# Patient Record
Sex: Male | Born: 1945 | Race: Black or African American | Hispanic: No | Marital: Single | Smoking: Never smoker
Health system: Southern US, Community
[De-identification: ages and names within clinical notes are randomized; demographics above are authoritative.]

## PROBLEM LIST (undated history)

## (undated) DIAGNOSIS — N289 Disorder of kidney and ureter, unspecified: Secondary | ICD-10-CM

## (undated) DIAGNOSIS — I10 Essential (primary) hypertension: Secondary | ICD-10-CM

## (undated) HISTORY — PX: SHOULDER SURGERY: SHX246

## (undated) HISTORY — PX: TRANSURETHRAL RESECTION OF PROSTATE: SHX73

---

## 1998-10-06 ENCOUNTER — Encounter: Admission: RE | Admit: 1998-10-06 | Discharge: 1998-11-24 | Payer: Self-pay | Admitting: Orthopaedic Surgery

## 2010-07-05 ENCOUNTER — Emergency Department (HOSPITAL_BASED_OUTPATIENT_CLINIC_OR_DEPARTMENT_OTHER): Admission: EM | Admit: 2010-07-05 | Discharge: 2010-07-06 | Payer: Self-pay | Admitting: Emergency Medicine

## 2010-07-05 ENCOUNTER — Ambulatory Visit: Payer: Self-pay | Admitting: Diagnostic Radiology

## 2010-11-03 LAB — URINALYSIS, ROUTINE W REFLEX MICROSCOPIC
Nitrite: NEGATIVE
Protein, ur: NEGATIVE mg/dL
Specific Gravity, Urine: 1.007 (ref 1.005–1.030)
Urobilinogen, UA: 0.2 mg/dL (ref 0.0–1.0)

## 2010-11-03 LAB — COMPREHENSIVE METABOLIC PANEL
ALT: 21 U/L (ref 0–53)
AST: 25 U/L (ref 0–37)
Albumin: 4.4 g/dL (ref 3.5–5.2)
CO2: 26 mEq/L (ref 19–32)
Calcium: 9.6 mg/dL (ref 8.4–10.5)
GFR calc Af Amer: >60 mL/min (ref >60)
GFR calc non Af Amer: 56 mL/min — ABNORMAL LOW (ref >60)
Sodium: 140 mEq/L (ref 135–145)
Total Protein: 7.7 g/dL (ref 6.0–8.3)

## 2010-11-03 LAB — CBC
Hemoglobin: 14.7 g/dL (ref 13.0–17.0)
MCHC: 34.6 g/dL (ref 30.0–36.0)
Platelets: 296 10*3/uL (ref 150–400)
RBC: 4.73 MIL/uL (ref 4.22–5.81)

## 2010-11-03 LAB — DIFFERENTIAL
Eosinophils Absolute: 0 10*3/uL (ref 0.0–0.7)
Eosinophils Relative: 1 % (ref 0–5)
Lymphs Abs: 1.5 10*3/uL (ref 0.7–4.0)
Monocytes Absolute: 0.4 10*3/uL (ref 0.1–1.0)
Monocytes Relative: 9 % (ref 3–12)

## 2010-11-03 LAB — POCT CARDIAC MARKERS
CKMB, poc: 1.5 ng/mL (ref 1.0–8.0)
Myoglobin, poc: 71.5 ng/mL (ref 12–200)
Troponin i, poc: <0.05 ng/mL (ref 0.00–0.09)

## 2015-10-31 ENCOUNTER — Emergency Department (HOSPITAL_BASED_OUTPATIENT_CLINIC_OR_DEPARTMENT_OTHER): Payer: No Typology Code available for payment source

## 2015-10-31 ENCOUNTER — Encounter (HOSPITAL_BASED_OUTPATIENT_CLINIC_OR_DEPARTMENT_OTHER): Payer: Self-pay | Admitting: Emergency Medicine

## 2015-10-31 ENCOUNTER — Emergency Department (HOSPITAL_BASED_OUTPATIENT_CLINIC_OR_DEPARTMENT_OTHER)
Admission: EM | Admit: 2015-10-31 | Discharge: 2015-10-31 | Disposition: A | Discharge status: Home / Self Care | Payer: No Typology Code available for payment source | Attending: Emergency Medicine | Admitting: Emergency Medicine

## 2015-10-31 DIAGNOSIS — T148XXA Other injury of unspecified body region, initial encounter: Secondary | ICD-10-CM

## 2015-10-31 DIAGNOSIS — S0990XA Unspecified injury of head, initial encounter: Secondary | ICD-10-CM | POA: Insufficient documentation

## 2015-10-31 DIAGNOSIS — I10 Essential (primary) hypertension: Secondary | ICD-10-CM | POA: Insufficient documentation

## 2015-10-31 DIAGNOSIS — S3992XA Unspecified injury of lower back, initial encounter: Secondary | ICD-10-CM | POA: Insufficient documentation

## 2015-10-31 DIAGNOSIS — Z87448 Personal history of other diseases of urinary system: Secondary | ICD-10-CM | POA: Insufficient documentation

## 2015-10-31 DIAGNOSIS — Y92410 Unspecified street and highway as the place of occurrence of the external cause: Secondary | ICD-10-CM | POA: Insufficient documentation

## 2015-10-31 DIAGNOSIS — T148 Other injury of unspecified body region: Secondary | ICD-10-CM | POA: Insufficient documentation

## 2015-10-31 DIAGNOSIS — Y9389 Activity, other specified: Secondary | ICD-10-CM | POA: Insufficient documentation

## 2015-10-31 DIAGNOSIS — Z880 Allergy status to penicillin: Secondary | ICD-10-CM | POA: Insufficient documentation

## 2015-10-31 DIAGNOSIS — S4991XA Unspecified injury of right shoulder and upper arm, initial encounter: Secondary | ICD-10-CM | POA: Insufficient documentation

## 2015-10-31 DIAGNOSIS — Y998 Other external cause status: Secondary | ICD-10-CM | POA: Insufficient documentation

## 2015-10-31 DIAGNOSIS — S199XXA Unspecified injury of neck, initial encounter: Secondary | ICD-10-CM | POA: Insufficient documentation

## 2015-10-31 HISTORY — DX: Disorder of kidney and ureter, unspecified: N28.9

## 2015-10-31 HISTORY — DX: Essential (primary) hypertension: I10

## 2015-10-31 MED ORDER — ACETAMINOPHEN 325 MG PO TABS
650.0000 mg | ORAL_TABLET | Freq: Once | ORAL | Status: AC
  Administered 2015-10-31: 650 mg via ORAL
  Filled 2015-10-31: qty 2

## 2015-10-31 MED ORDER — CYCLOBENZAPRINE HCL 5 MG PO TABS: 5.0000 mg | ORAL_TABLET | Freq: Three times a day (TID) | ORAL | Status: DC | PRN

## 2015-10-31 MED ORDER — TRAMADOL HCL 50 MG PO TABS: 50.0000 mg | ORAL_TABLET | Freq: Four times a day (QID) | ORAL | Status: DC | PRN
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
## 2015-10-31 NOTE — Discharge Instructions (Signed)
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
## 2015-11-07 ENCOUNTER — Emergency Department (HOSPITAL_BASED_OUTPATIENT_CLINIC_OR_DEPARTMENT_OTHER): Payer: No Typology Code available for payment source

## 2015-11-07 ENCOUNTER — Emergency Department (HOSPITAL_BASED_OUTPATIENT_CLINIC_OR_DEPARTMENT_OTHER)
Admission: EM | Admit: 2015-11-07 | Discharge: 2015-11-07 | Disposition: A | Discharge status: Home / Self Care | Payer: No Typology Code available for payment source | Attending: Emergency Medicine | Admitting: Emergency Medicine

## 2015-11-07 ENCOUNTER — Encounter (HOSPITAL_BASED_OUTPATIENT_CLINIC_OR_DEPARTMENT_OTHER): Payer: Self-pay

## 2015-11-07 DIAGNOSIS — Z87448 Personal history of other diseases of urinary system: Secondary | ICD-10-CM | POA: Insufficient documentation

## 2015-11-07 DIAGNOSIS — Z79899 Other long term (current) drug therapy: Secondary | ICD-10-CM | POA: Diagnosis not present

## 2015-11-07 DIAGNOSIS — S46911A Strain of unspecified muscle, fascia and tendon at shoulder and upper arm level, right arm, initial encounter: Secondary | ICD-10-CM | POA: Insufficient documentation

## 2015-11-07 DIAGNOSIS — S4991XA Unspecified injury of right shoulder and upper arm, initial encounter: Secondary | ICD-10-CM | POA: Diagnosis present

## 2015-11-07 DIAGNOSIS — Y9389 Activity, other specified: Secondary | ICD-10-CM | POA: Insufficient documentation

## 2015-11-07 DIAGNOSIS — S199XXA Unspecified injury of neck, initial encounter: Secondary | ICD-10-CM | POA: Diagnosis not present

## 2015-11-07 DIAGNOSIS — Y92410 Unspecified street and highway as the place of occurrence of the external cause: Secondary | ICD-10-CM | POA: Insufficient documentation

## 2015-11-07 DIAGNOSIS — Z880 Allergy status to penicillin: Secondary | ICD-10-CM | POA: Insufficient documentation

## 2015-11-07 DIAGNOSIS — Y998 Other external cause status: Secondary | ICD-10-CM | POA: Diagnosis not present

## 2015-11-07 DIAGNOSIS — I10 Essential (primary) hypertension: Secondary | ICD-10-CM | POA: Insufficient documentation
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
## 2015-11-07 NOTE — Discharge Instructions (Signed)
Rotator Cuff Injury °Rotator cuff injury is any type of injury to the set of muscles and tendons that make up the stabilizing unit of your shoulder. This unit holds the ball of your upper arm bone (humerus) in the socket of your shoulder blade (scapula).  °CAUSES °Injuries to your rotator cuff most commonly come from sports or activities that cause your arm to be moved repeatedly over your head. Examples of this include throwing, weight lifting, swimming, or racquet sports. Long lasting (chronic) irritation of your rotator cuff can cause soreness and swelling (inflammation), bursitis, and eventual damage to your tendons, such as a tear (rupture). °SIGNS AND SYMPTOMS °Acute rotator cuff tear: °· Sudden tearing sensation followed by severe pain shooting from your upper shoulder down your arm toward your elbow. °· Decreased range of motion of your shoulder because of pain and muscle spasm. °· Severe pain. °· Inability to raise your arm out to the side because of pain and loss of muscle power (large tears). °Chronic rotator cuff tear: °· Pain that usually is worse at night and may interfere with sleep. °· Gradual weakness and decreased shoulder motion as the pain worsens. °· Decreased range of motion. °Rotator cuff tendinitis:  °· Deep ache in your shoulder and the outside upper arm over your shoulder. °· Pain that comes on gradually and becomes worse when lifting your arm to the side or turning it inward. °DIAGNOSIS °Rotator cuff injury is diagnosed through a medical history, physical exam, and imaging exam. The medical history helps determine the type of rotator cuff injury. Your health care provider will look at your injured shoulder, feel the injured area, and ask you to move your shoulder in different positions. X-ray exams typically are done to rule out other causes of shoulder pain, such as fractures. MRI is the exam of choice for the most severe shoulder injuries because the images show muscles and tendons.    °TREATMENT  °Chronic tear: °· Medicine for pain, such as acetaminophen or ibuprofen. °· Physical therapy and range-of-motion exercises may be helpful in maintaining shoulder function and strength. °· Steroid injections into your shoulder joint. °· Surgical repair of the rotator cuff if the injury does not heal with noninvasive treatment. °Acute tear: °· Anti-inflammatory medicines such as ibuprofen and naproxen to help reduce pain and swelling. °· A sling to help support your arm and rest your rotator cuff muscles. Long-term use of a sling is not advised. It may cause significant stiffening of the shoulder joint. °· Surgery may be considered within a few weeks, especially in younger, active people, to return the shoulder to full function. °· Indications for surgical treatment include the following: °¨ Age younger than 60 years. °¨ Rotator cuff tears that are complete. °¨ Physical therapy, rest, and anti-inflammatory medicines have been used for 6-8 weeks, with no improvement. °¨ Employment or sporting activity that requires constant shoulder use. °Tendinitis: °· Anti-inflammatory medicines such as ibuprofen and naproxen to help reduce pain and swelling. °· A sling to help support your arm and rest your rotator cuff muscles. Long-term use of a sling is not advised. It may cause significant stiffening of the shoulder joint. °· Severe tendinitis may require: °¨ Steroid injections into your shoulder joint. °¨ Physical therapy. °¨ Surgery. °HOME CARE INSTRUCTIONS  °· Apply ice to your injury: °¨ Put ice in a plastic bag. °¨ Place a towel between your skin and the bag. °¨ Leave the ice on for 20 minutes, 2-3 times a day. °· If you   have a shoulder immobilizer (sling and straps), wear it until told otherwise by your health care provider. °· You may want to sleep on several pillows or in a recliner at night to lessen swelling and pain. °· Only take over-the-counter or prescription medicines for pain, discomfort, or fever as  directed by your health care provider. °· Do simple hand squeezing exercises with a soft rubber ball to decrease hand swelling. °SEEK MEDICAL CARE IF:  °· Your shoulder pain increases, or new pain or numbness develops in your arm, hand, or fingers. °· Your hand or fingers are colder than your other hand. °SEEK IMMEDIATE MEDICAL CARE IF:  °· Your arm, hand, or fingers are numb or tingling. °· Your arm, hand, or fingers are increasingly swollen and painful, or they turn white or blue. °MAKE SURE YOU: °· Understand these instructions. °· Will watch your condition. °· Will get help right away if you are not doing well or get worse. °  °This information is not intended to replace advice given to you by your health care provider. Make sure you discuss any questions you have with your health care provider. °  °Document Released: 08/06/2000 Document Revised: 08/14/2013 Document Reviewed: 03/21/2013 °Elsevier Interactive Patient Education ©2016 [employer] ° °

## 2015-11-07 NOTE — ED Notes (Signed)
Pt verbalizes understanding of d/c instructions and denies any further needs at this time. 

## 2016-09-19 ENCOUNTER — Encounter (HOSPITAL_BASED_OUTPATIENT_CLINIC_OR_DEPARTMENT_OTHER): Payer: Self-pay | Admitting: *Deleted

## 2016-09-19 ENCOUNTER — Emergency Department (HOSPITAL_BASED_OUTPATIENT_CLINIC_OR_DEPARTMENT_OTHER)
Admission: EM | Admit: 2016-09-19 | Discharge: 2016-09-19 | Disposition: A | Discharge status: Home / Self Care | Payer: Medicare Other | Attending: Emergency Medicine | Admitting: Emergency Medicine

## 2016-09-19 DIAGNOSIS — Z79899 Other long term (current) drug therapy: Secondary | ICD-10-CM | POA: Insufficient documentation

## 2016-09-19 DIAGNOSIS — M545 Low back pain: Secondary | ICD-10-CM | POA: Diagnosis present

## 2016-09-19 DIAGNOSIS — M5416 Radiculopathy, lumbar region: Secondary | ICD-10-CM | POA: Diagnosis not present

## 2016-09-19 DIAGNOSIS — M5410 Radiculopathy, site unspecified: Secondary | ICD-10-CM

## 2016-09-19 DIAGNOSIS — I10 Essential (primary) hypertension: Secondary | ICD-10-CM | POA: Insufficient documentation

## 2016-09-19 DIAGNOSIS — M5441 Lumbago with sciatica, right side: Secondary | ICD-10-CM | POA: Diagnosis not present

## 2016-09-19 MED ORDER — DEXAMETHASONE 6 MG PO TABS
10.0000 mg | ORAL_TABLET | Freq: Once | ORAL | Status: AC
  Administered 2016-09-19: 10 mg via ORAL
  Filled 2016-09-19: qty 1

## 2016-09-19 MED ORDER — TRAMADOL HCL 50 MG PO TABS: 50.0000 mg | ORAL_TABLET | Freq: Four times a day (QID) | ORAL | 0 refills | Status: DC | PRN
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
## 2016-09-19 NOTE — ED Provider Notes (Signed)
[facility] Provider Note   CSN: [id number] Arrival date & time: 09/19/16  1325  By signing my name below, I, Modena Jansky, attest that this documentation has been prepared under the direction and in the presence of Alvira Monday, MD. Electronically Signed: Modena Jansky, Scribe. 09/19/2016. 4:08 PM.  History   Chief Complaint Chief Complaint  Patient presents with  . Leg Pain   The history is provided by the patient. No language interpreter was used.   HPI Comments: Marc Vargas is a 71 y.o. male who presents to the Emergency Department complaining of intermittent moderate lower back pain that started about 3 weeks ago. He states he fell and twisted his RLE, and his pain has gradually worsened since. He tried soaking himself in epsom salts and flexeril without relief. His pain is relieved by sitting and exacerbated by standing and ambulation. He describes the pain as a dull, aching sensation radiating to his buttocks and right shin. He currently rates the pain as an 8/10. No associated symptoms. He admits to a prior hx of back problems. He denies any numbness, saddle paresthesias, fever, bowel/bladder incontinence, leg swelling, weakness, or SOB. No IVDU or cancer hx.    PCP: Ananias Pilgrim, MD  Past Medical History:  Diagnosis Date  . Hypertension   . Renal disorder     There are no active problems to display for this patient.   Past Surgical History:  Procedure Laterality Date  . SHOULDER SURGERY         Home Medications    Prior to Admission medications   Medication Sig Start Date End Date Taking? Authorizing Provider  amLODipine-atorvastatin (CADUET) 10-40 MG tablet Take 1 tablet by mouth daily.   Yes Historical Provider, MD  lisinopril (PRINIVIL,ZESTRIL) 10 MG tablet Take 10 mg by mouth daily.   Yes Historical Provider, MD  cyclobenzaprine (FLEXERIL) 5 MG tablet Take 1 tablet (5 mg total) by mouth 3 (three) times daily as needed for muscle spasms.  10/31/15   Linwood Dibbles, MD  traMADol (ULTRAM) 50 MG tablet Take 1 tablet (50 mg total) by mouth every 6 (six) hours as needed. 09/19/16   Alvira Monday, MD    Family History No family history on file.  Social History Social History  Substance Use Topics  . Smoking status: Never Smoker  . Smokeless tobacco: Never Used  . Alcohol use No     Allergies   Penicillins   Review of Systems Review of Systems  Constitutional: Negative for fever.  HENT: Negative for sore throat.   Eyes: Negative for visual disturbance.  Respiratory: Negative for shortness of breath.   Cardiovascular: Negative for chest pain and leg swelling.  Gastrointestinal: Negative for abdominal pain.  Genitourinary: Negative for difficulty urinating.  Musculoskeletal: Positive for back pain (Lower). Negative for neck stiffness.  Skin: Negative for rash.  Neurological: Negative for syncope, weakness, numbness and headaches.     Physical Exam Updated Vital Signs BP 132/71 (BP Location: Right Arm)   Pulse 74   Temp 98.2 F (36.8 C) (Oral)   Resp 18   Ht 5\' 7"  (1.702 m)   Wt 207 lb (93.9 kg)   SpO2 100%   BMI 32.42 kg/m   Physical Exam  Constitutional: He is oriented to person, place, and time. He appears well-developed and well-nourished. No distress.  HENT:  Head: Normocephalic and atraumatic.  Eyes: Conjunctivae are normal.  Neck: Neck supple.  Cardiovascular: Normal rate and regular rhythm.   Pulmonary/Chest: Effort normal.  No respiratory distress. He has no wheezes. He has no rales.  Abdominal: Soft.  Musculoskeletal: Normal range of motion. He exhibits no edema.  Right paraspinal TTP. No calf TTP. Strength in BLE is 5/5, with hip flexion/extension, knee flexion/extension, and plantar flexion and dorsiflexion. Sensation intact.   Neurological: He is alert and oriented to person, place, and time.  Skin: Skin is warm and dry.  Psychiatric: He has a normal mood and affect.  Nursing note and vitals  reviewed.    ED Treatments / Results  DIAGNOSTIC STUDIES: Oxygen Saturation is 100% on RA, normal by my interpretation.    COORDINATION OF CARE: 4:12 PM- Pt advised of plan for treatment and pt agrees.  Labs (all labs ordered are listed, but only abnormal results are displayed) Labs Reviewed - No data to display  EKG  EKG Interpretation None       Radiology No results found.  Procedures Procedures (including critical care time)  Medications Ordered in ED Medications  dexamethasone (DECADRON) tablet 10 mg (10 mg Oral Given 09/19/16 1627)     Initial Impression / Assessment and Plan / ED Course  I have reviewed the triage vital signs and the nursing notes.  Pertinent labs & imaging results that were available during my care of the patient were reviewed by me and considered in my medical decision making (see chart for details).     71yo male presents with concern for back pain. Patient has a normal neurologic exam and denies any urinary retention or overflow incontinence, stool incontinence, saddle anesthesia, fever, IV drug use, trauma, chronic steroid use or immunocompromise and have low suspicion suspicion for cauda equina, fracture, epidural abscess, or vertebral osteomyelitis.  Patient describing radicular pain, possible disc herniation or other.  Reviewed pt in the drug database. Wrote rx for tramadol and recommended PCP follow up for further pain. Patient discharged in stable condition with understanding of reasons to return.   Final Clinical Impressions(s) / ED Diagnoses   Final diagnoses:  Radicular pain  Acute right-sided low back pain with right-sided sciatica    New Prescriptions Discharge Medication List as of 09/19/2016  4:24 PM     I personally performed the services described in this documentation, which was scribed in my presence. The recorded information has been reviewed and is accurate.     Alvira MondayErin Derotha Fishbaugh, MD 09/20/16 [phone]

## 2016-09-23 DEATH — deceased

## 2017-09-18 ENCOUNTER — Encounter (HOSPITAL_BASED_OUTPATIENT_CLINIC_OR_DEPARTMENT_OTHER): Payer: Self-pay | Admitting: Emergency Medicine

## 2017-09-18 ENCOUNTER — Other Ambulatory Visit: Payer: Self-pay

## 2017-09-18 ENCOUNTER — Emergency Department (HOSPITAL_BASED_OUTPATIENT_CLINIC_OR_DEPARTMENT_OTHER)
Admission: EM | Admit: 2017-09-18 | Discharge: 2017-09-18 | Disposition: A | Discharge status: Home / Self Care | Payer: Medicare Other | Attending: Emergency Medicine | Admitting: Emergency Medicine

## 2017-09-18 ENCOUNTER — Emergency Department (HOSPITAL_BASED_OUTPATIENT_CLINIC_OR_DEPARTMENT_OTHER): Payer: Medicare Other

## 2017-09-18 DIAGNOSIS — N289 Disorder of kidney and ureter, unspecified: Secondary | ICD-10-CM

## 2017-09-18 DIAGNOSIS — Z79899 Other long term (current) drug therapy: Secondary | ICD-10-CM | POA: Insufficient documentation

## 2017-09-18 DIAGNOSIS — R079 Chest pain, unspecified: Secondary | ICD-10-CM | POA: Diagnosis present

## 2017-09-18 DIAGNOSIS — I10 Essential (primary) hypertension: Secondary | ICD-10-CM | POA: Insufficient documentation

## 2017-09-18 LAB — HEPATIC FUNCTION PANEL
ALT: 17 U/L (ref 17–63)
AST: 24 U/L (ref 15–41)
Albumin: 4.3 g/dL (ref 3.5–5.0)
Alkaline Phosphatase: 67 U/L (ref 38–126)
BILIRUBIN DIRECT: 0.2 mg/dL (ref 0.1–0.5)
BILIRUBIN TOTAL: 1.6 mg/dL — AB (ref 0.3–1.2)
Indirect Bilirubin: 1.4 mg/dL — ABNORMAL HIGH (ref 0.3–0.9)
Total Protein: 7.3 g/dL (ref 6.5–8.1)

## 2017-09-18 LAB — BASIC METABOLIC PANEL
ANION GAP: 8 (ref 5–15)
BUN: 21 mg/dL — AB (ref 6–20)
CALCIUM: 9.3 mg/dL (ref 8.9–10.3)
CO2: 25 mmol/L (ref 22–32)
CREATININE: 1.69 mg/dL — AB (ref 0.61–1.24)
Chloride: 108 mmol/L (ref 101–111)
GFR calc Af Amer: 45 mL/min — ABNORMAL LOW (ref >60)
GFR, EST NON AFRICAN AMERICAN: 39 mL/min — AB (ref >60)
GLUCOSE: 135 mg/dL — AB (ref 65–99)
Potassium: 4 mmol/L (ref 3.5–5.1)
Sodium: 141 mmol/L (ref 135–145)

## 2017-09-18 LAB — CBC
HCT: 41.4 % (ref 39.0–52.0)
Hemoglobin: 14.5 g/dL (ref 13.0–17.0)
MCH: 31.6 pg (ref 26.0–34.0)
MCHC: 35 g/dL (ref 30.0–36.0)
MCV: 90.2 fL (ref 78.0–100.0)
PLATELETS: 242 10*3/uL (ref 150–400)
RBC: 4.59 MIL/uL (ref 4.22–5.81)
RDW: 12.8 % (ref 11.5–15.5)
WBC: 5.7 10*3/uL (ref 4.0–10.5)

## 2017-09-18 LAB — TROPONIN I

## 2017-09-18 LAB — LIPASE, BLOOD: LIPASE: 42 U/L (ref 11–51)

## 2017-09-18 NOTE — ED Provider Notes (Signed)
[facility] EMERGENCY DEPARTMENT Provider Note   CSN: [id number] Arrival date & time: 09/18/17  0735     History   Chief Complaint Chief Complaint  Patient presents with  . Chest Pain    Described as discomfort and feeling like something is stuck right in the middle    HPI Marc Vargas is a 72 y.o. male.  HPI Patient presents with mid chest fullness.  Has had constantly for the last week.  Feels as if something is stuck in the middle.  Not change his exercise.  Not change with eating.  No nausea or vomiting.  States he has had some worsening reflux.  States that has had previous cardiac workup with no pathology.  Has had previous endoscopy around 6 years ago.  States at that time the asked if he had been drinking because of some of the findings but states he does not drink alcohol.  He does not smoke.  No nausea or vomiting.  No weight loss. Past Medical History:  Diagnosis Date  . Hypertension   . Renal disorder     There are no active problems to display for this patient.   Past Surgical History:  Procedure Laterality Date  . SHOULDER SURGERY         Home Medications    Prior to Admission medications   Medication Sig Start Date End Date Taking? Authorizing Provider  amLODipine-atorvastatin (CADUET) 10-40 MG tablet Take 1 tablet by mouth daily.   Yes [provider]  cyclobenzaprine (FLEXERIL) 5 MG tablet Take 1 tablet (5 mg total) by mouth 3 (three) times daily as needed for muscle spasms. 10/31/15  Yes Linwood DibblesKnapp, Jon, MD  lisinopril (PRINIVIL,ZESTRIL) 10 MG tablet Take 10 mg by mouth daily.   Yes [provider]  traMADol (ULTRAM) 50 MG tablet Take 1 tablet (50 mg total) by mouth every 6 (six) hours as needed. 09/19/16  Yes Alvira MondaySchlossman, Erin, MD    Family History No family history on file.  Social History Social History   Tobacco Use  . Smoking status: Never Smoker  . Smokeless tobacco: Never Used  Substance Use Topics  . Alcohol  use: No  . Drug use: No     Allergies   Penicillins and Propofol   Review of Systems Review of Systems  Constitutional: Negative for fever.  HENT: Negative for congestion.   Respiratory: Negative for shortness of breath.   Cardiovascular: Positive for chest pain.  Gastrointestinal: Negative for abdominal pain and constipation.  Genitourinary: Negative for flank pain.  Musculoskeletal: Negative for back pain.  Neurological: Negative for weakness and numbness.  Hematological: Negative for adenopathy.  Psychiatric/Behavioral: Negative for confusion.     Physical Exam Updated Vital Signs BP (!) 141/74   Pulse 60   Temp 98.1 F (36.7 C) (Oral)   Resp 14   Ht 5\' 7"  (1.702 m)   Wt 88 kg (194 lb)   SpO2 99%   BMI 30.38 kg/m   Physical Exam  Constitutional: He appears well-developed.  HENT:  Head: Normocephalic.  Neck: Neck supple.  Cardiovascular: Normal rate, regular rhythm and normal pulses.  Pulmonary/Chest: Effort normal.  Abdominal: He exhibits no mass. There is no tenderness.  Musculoskeletal:       Right lower leg: He exhibits no edema.       Left lower leg: He exhibits no edema.  Neurological: He is alert.  Skin: Skin is warm. Capillary refill takes less than 2 seconds.  ED Treatments / Results  Labs (all labs ordered are listed, but only abnormal results are displayed) Labs Reviewed  BASIC METABOLIC PANEL - Abnormal; Notable for the following components:      Result Value   Glucose, Bld 135 (*)    BUN 21 (*)    Creatinine, Ser 1.69 (*)    GFR calc non Af Amer 39 (*)    GFR calc Af Amer 45 (*)    All other components within normal limits  HEPATIC FUNCTION PANEL - Abnormal; Notable for the following components:   Total Bilirubin 1.6 (*)    Indirect Bilirubin 1.4 (*)    All other components within normal limits  CBC  TROPONIN I  LIPASE, BLOOD    EKG  EKG Interpretation  Date/Time:  Sunday September 18 2017 07:47:55 EST Ventricular Rate:   69 PR Interval:    QRS Duration: 86 QT Interval:  395 QTC Calculation: 424 R Axis:   -10 Text Interpretation:  Sinus rhythm Borderline T wave abnormalities No significant change since last tracing Confirmed by Benjiman Core [phone]) on 09/18/2017 8:00:59 AM       Radiology Dg Chest 2 View  Result Date: 09/18/2017 CLINICAL DATA:  72 year old male with a history of chest pain EXAM: CHEST  2 VIEW COMPARISON:  02/23/2014 FINDINGS: Cardiomediastinal silhouette unchanged in size and contour. Low lung volumes, accentuating the interstitium. No pneumothorax or pleural effusion. Likely chronic changes at the lung bases. No displaced fracture Surgical changes of the right humeral head IMPRESSION: Chronic lung changes without evidence of superimposed acute cardiopulmonary disease. Electronically Signed   By: Gilmer Mor D.O.   On: 09/18/2017 08:39    Procedures Procedures (including critical care time)  Medications Ordered in ED Medications - No data to display   Initial Impression / Assessment and Plan / ED Course  I have reviewed the triage vital signs and the nursing notes.  Pertinent labs & imaging results that were available during my care of the patient were reviewed by me and considered in my medical decision making (see chart for details).     Patient presents with mid chest tightness.  Feels as if something is in there.  Has chronic renal insufficiency which appears to have worsened somewhat.  It does not initially worsen with eating.  EKG reassuring.  Troponin negative.  Chest x-ray reassuring.  Has had some previous esophageal or stomach abnormality but unsure what it was.  Has recently been on some steroids which could cause gastritis or ulcer.  Already on Nexium.  Will have follow-up with PCP nephrology probably his gastroenterologist.  Doubt cardiac cause.  Will discharge home.  Final Clinical Impressions(s) / ED Diagnoses   Final diagnoses:  Nonspecific chest pain  Renal  insufficiency    ED Discharge Orders    None       Benjiman Core, MD 09/18/17 [phone]

## 2017-09-18 NOTE — ED Triage Notes (Signed)
Pt c/o center chest discomfort occurred after eating, described as feeling like something is stuck x 1 week. Pt reports symptoms of GERD.

## 2017-09-18 NOTE — Discharge Instructions (Signed)
Follow-up with your primary care doctor.  You may need to see gastroenterology for the pain also.  Kidney function also appears to be worse than baseline.

## 2021-11-15 ENCOUNTER — Encounter (HOSPITAL_BASED_OUTPATIENT_CLINIC_OR_DEPARTMENT_OTHER): Payer: Self-pay

## 2021-11-15 ENCOUNTER — Emergency Department (HOSPITAL_BASED_OUTPATIENT_CLINIC_OR_DEPARTMENT_OTHER): Payer: Medicare Other

## 2021-11-15 ENCOUNTER — Other Ambulatory Visit: Payer: Self-pay

## 2021-11-15 ENCOUNTER — Emergency Department (HOSPITAL_BASED_OUTPATIENT_CLINIC_OR_DEPARTMENT_OTHER)
Admission: EM | Admit: 2021-11-15 | Discharge: 2021-11-15 | Disposition: A | Discharge status: Home / Self Care | Payer: Medicare Other | Attending: Emergency Medicine | Admitting: Emergency Medicine

## 2021-11-15 DIAGNOSIS — M25512 Pain in left shoulder: Secondary | ICD-10-CM | POA: Insufficient documentation

## 2021-11-15 DIAGNOSIS — Y990 Civilian activity done for income or pay: Secondary | ICD-10-CM | POA: Diagnosis not present

## 2021-11-15 DIAGNOSIS — X500XXA Overexertion from strenuous movement or load, initial encounter: Secondary | ICD-10-CM | POA: Diagnosis not present

## 2021-11-15 MED ORDER — NAPROXEN 250 MG PO TABS
500.0000 mg | ORAL_TABLET | Freq: Once | ORAL | Status: DC
  Filled 2021-11-15: qty 2

## 2021-11-15 MED ORDER — HYDROCODONE-ACETAMINOPHEN 5-325 MG PO TABS
1.0000 | ORAL_TABLET | Freq: Once | ORAL | Status: DC
  Filled 2021-11-15: qty 1

## 2021-11-15 MED ORDER — ACETAMINOPHEN 325 MG PO TABS
650.0000 mg | ORAL_TABLET | Freq: Once | ORAL | Status: AC
  Administered 2021-11-15: 650 mg via ORAL
  Filled 2021-11-15: qty 2

## 2021-11-15 MED ORDER — LIDOCAINE 5 % EX PTCH: 1.0000 | MEDICATED_PATCH | CUTANEOUS | 0 refills | Status: DC

## 2021-11-15 NOTE — Discharge Instructions (Signed)
Please take Tylenol every 6 hours as needed for pain.  I will refill your lidocaine patches.  Please follow-up with orthopedics for further evaluation. ?

## 2021-11-15 NOTE — ED Triage Notes (Signed)
C/o left shoulder pain x 1 week, pain worse with lifting arm. 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 Some improvement with lidocaine patch ?

## 2021-11-15 NOTE — ED Provider Notes (Signed)
?[facility] EMERGENCY DEPARTMENT ?Provider Note ? ? ?CSN: [id number] ?Arrival date & time: 11/15/21  2878 ? ?  ? ?History ?Chief Complaint  ?Patient presents with  ? Shoulder Pain  ? ? ?Marc Vargas is a 76 y.o. male who presents the emergency department with left shoulder pain that has been constant over the last 2 weeks.  Patient states he was pushing himself off while getting up from a chair and felt pain.  Pain is worse when he raises his arm above his head.  He denies any focal weakness or numbness.  No additional injury or trauma. ? ? ?Shoulder Pain ? ?  ? ?Home Medications ?Prior to Admission medications   ?Medication Sig Start Date End Date Taking? Authorizing Provider  ?lidocaine (LIDODERM) 5 % Place 1 patch onto the skin daily. Remove & Discard patch within 12 hours or as directed by MD 11/15/21  Yes Meredeth Ide, Jannel Lynne M, PA-C  ?amLODipine-atorvastatin (CADUET) 10-40 MG tablet Take 1 tablet by mouth daily.    [provider]  ?cyclobenzaprine (FLEXERIL) 5 MG tablet Take 1 tablet (5 mg total) by mouth 3 (three) times daily as needed for muscle spasms. 10/31/15   Linwood Dibbles, MD  ?lisinopril (PRINIVIL,ZESTRIL) 10 MG tablet Take 10 mg by mouth daily.    [provider]  ?traMADol (ULTRAM) 50 MG tablet Take 1 tablet (50 mg total) by mouth every 6 (six) hours as needed. 09/19/16   Alvira Monday, MD  ?   ? ?Allergies    ?Penicillins and Propofol   ? ?Review of Systems   ?Review of Systems  ?All other systems reviewed and are negative. ? ?Physical Exam ?Updated Vital Signs ?BP (!) 144/70 (BP Location: Right Arm)   Pulse 66   Temp 98 ?F (36.7 ?C) (Oral)   Resp 17   Ht 5\' 6"  (1.676 m)   Wt 82.6 kg   SpO2 100%   BMI 29.38 kg/m?  ?Physical Exam ?Vitals and nursing note reviewed.  ?Constitutional:   ?   Appearance: Normal appearance.  ?HENT:  ?   Head: Normocephalic and atraumatic.  ?Eyes:  ?   General:     ?   Right eye: No discharge.     ?   Left eye: No discharge.  ?    Conjunctiva/sclera: Conjunctivae normal.  ?Pulmonary:  ?   Effort: Pulmonary effort is normal.  ?Musculoskeletal:  ?   Comments: Positive speeds test.  There is tenderness over the left bicep tendon just over the bicipital groove.  Strong 2+ radial pulse felt on the left wrist.  Good cap refill in the fingers.  Normal sensation.  Good strength.  Patient does have good range of motion however when raising the arm above the head it is painful.  ?Skin: ?   General: Skin is warm and dry.  ?   Findings: No rash.  ?Neurological:  ?   General: No focal deficit present.  ?   Mental Status: He is alert.  ?Psychiatric:     ?   Mood and Affect: Mood normal.     ?   Behavior: Behavior normal.  ? ? ?ED Results / Procedures / Treatments   ?Labs ?(all labs ordered are listed, but only abnormal results are displayed) ?Labs Reviewed - No data to display ? ?EKG ?None ? ?Radiology ?DG Shoulder Left ? ?Result Date: 11/15/2021 ?CLINICAL DATA:  Shoulder pain EXAM: LEFT SHOULDER - 2+ VIEW COMPARISON:  None. FINDINGS: No acute fracture or dislocation identified.  Moderate narrowing of the glenohumeral joint and mild narrowing of the acromioclavicular joint with prominent inferior marginal osteophytes at the [facility] joint. 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 Electronically Signed   By: Jannifer Hick M.D.   On: 11/15/2021 10:35   ? ?Procedures ?Procedures  ? ? ?Medications Ordered in ED ?Medications  ?acetaminophen (TYLENOL) tablet 650 mg (650 mg Oral Given 11/15/21 1100)  ? ? ?ED Course/ Medical Decision Making/ A&P ?  ?                        ?Medical Decision Making ?Amount and/or Complexity of Data Reviewed ?Radiology: ordered. ? ?Risk ?Prescription drug management. ? ? ?RENALD Vargas is a 76 y.o. male who presents to the emergency department with left shoulder pain and has been ongoing for the last 2 weeks.  Differential diagnosis includes rotator cuff injury, impingement syndrome, biceps  tendinopathy, muscular spasm, bony fracture or dislocation.  Patient does have full range of motion however it is painful when arm is raised above shoulder.  There was evidence of positive speeds test with point tenderness to the bicep tendon over the bicipital groove.  Patient only has 1 patch of lidocaine left.  I will refill this.  Will have him take Tylenol and follow-up with orthopedics.  No weakness to the arm or numbness.  Strict return precautions were discussed with the patient.  He is safe for discharge. ? ?Final Clinical Impression(s) / ED Diagnoses ?Final diagnoses:  ?Acute pain of left shoulder  ? ? ?Rx / DC Orders ?ED Discharge Orders   ? ?      Ordered  ?  lidocaine (LIDODERM) 5 %  Every 24 hours       ? 11/15/21 1055  ? ?  ?  ? ?  ? ? ?  ?Teressa Lower, PA-C ?11/15/21 1105 ? ?  ?Linwood Dibbles, MD ?11/16/21 [phone] ? ?

## 2021-12-09 ENCOUNTER — Encounter (HOSPITAL_BASED_OUTPATIENT_CLINIC_OR_DEPARTMENT_OTHER): Payer: Self-pay | Admitting: Emergency Medicine

## 2021-12-09 ENCOUNTER — Other Ambulatory Visit: Payer: Self-pay

## 2021-12-09 ENCOUNTER — Emergency Department (HOSPITAL_BASED_OUTPATIENT_CLINIC_OR_DEPARTMENT_OTHER)
Admission: EM | Admit: 2021-12-09 | Discharge: 2021-12-09 | Disposition: A | Discharge status: Home / Self Care | Payer: Medicare Other | Attending: Emergency Medicine | Admitting: Emergency Medicine

## 2021-12-09 DIAGNOSIS — D649 Anemia, unspecified: Secondary | ICD-10-CM | POA: Insufficient documentation

## 2021-12-09 DIAGNOSIS — R1030 Lower abdominal pain, unspecified: Secondary | ICD-10-CM

## 2021-12-09 DIAGNOSIS — R1031 Right lower quadrant pain: Secondary | ICD-10-CM | POA: Insufficient documentation

## 2021-12-09 LAB — CBC WITH DIFFERENTIAL/PLATELET
Abs Immature Granulocytes: 0.02 10*3/uL (ref 0.00–0.07)
Basophils Absolute: 0 10*3/uL (ref 0.0–0.1)
Basophils Relative: 1 %
Eosinophils Absolute: 0.1 10*3/uL (ref 0.0–0.5)
Eosinophils Relative: 1 %
HCT: 35.4 % — ABNORMAL LOW (ref 39.0–52.0)
Hemoglobin: 12.3 g/dL — ABNORMAL LOW (ref 13.0–17.0)
Immature Granulocytes: 0 %
Lymphocytes Relative: 29 %
Lymphs Abs: 1.8 10*3/uL (ref 0.7–4.0)
MCH: 31.1 pg (ref 26.0–34.0)
MCHC: 34.7 g/dL (ref 30.0–36.0)
MCV: 89.4 fL (ref 80.0–100.0)
Monocytes Absolute: 0.6 10*3/uL (ref 0.1–1.0)
Monocytes Relative: 10 %
Neutro Abs: 3.7 10*3/uL (ref 1.7–7.7)
Neutrophils Relative %: 59 %
Platelets: 285 10*3/uL (ref 150–400)
RBC: 3.96 MIL/uL — ABNORMAL LOW (ref 4.22–5.81)
RDW: 12.5 % (ref 11.5–15.5)
WBC: 6.2 10*3/uL (ref 4.0–10.5)
nRBC: 0 % (ref 0.0–0.2)

## 2021-12-09 LAB — COMPREHENSIVE METABOLIC PANEL
ALT: 15 U/L (ref 0–44)
AST: 19 U/L (ref 15–41)
Albumin: 4.2 g/dL (ref 3.5–5.0)
Alkaline Phosphatase: 78 U/L (ref 38–126)
Anion gap: 8 (ref 5–15)
BUN: 30 mg/dL — ABNORMAL HIGH (ref 8–23)
CO2: 19 mmol/L — ABNORMAL LOW (ref 22–32)
Calcium: 9 mg/dL (ref 8.9–10.3)
Chloride: 108 mmol/L (ref 98–111)
Creatinine, Ser: 1.97 mg/dL — ABNORMAL HIGH (ref 0.61–1.24)
GFR, Estimated: 35 mL/min — ABNORMAL LOW (ref >60)
Glucose, Bld: 92 mg/dL (ref 70–99)
Potassium: 4.2 mmol/L (ref 3.5–5.1)
Sodium: 135 mmol/L (ref 135–145)
Total Bilirubin: 1.3 mg/dL — ABNORMAL HIGH (ref 0.3–1.2)
Total Protein: 7 g/dL (ref 6.5–8.1)

## 2021-12-09 LAB — MAGNESIUM: Magnesium: 2.1 mg/dL (ref 1.7–2.4)

## 2021-12-09 MED ORDER — SODIUM CHLORIDE 0.9 % IV BOLUS
1000.0000 mL | Freq: Once | INTRAVENOUS | Status: AC
Start: 2021-12-09 — End: 2021-12-09
  Administered 2021-12-09: 1000 mL via INTRAVENOUS

## 2021-12-09 NOTE — ED Provider Notes (Signed)
?[facility] EMERGENCY DEPARTMENT ?Provider Note ? ? ?CSN: [id number] ?Arrival date & time: 12/09/21  2008 ? ?  ? ?History ? ?Chief Complaint  ?Patient presents with  ? Abdominal Pain  ? ? ?Marc Vargas is a 76 y.o. male. ? ?76 yo M with a chief complaints of pain underneath his testicles that occurs when he has a bowel movement.  This been going on for a few months now.  He tells me that he gets a bit of a discomfort down there he takes a laxative and then moves his bowels and sometimes feels like that goes into his right lower quadrant.  He has had trouble having a bowel movement without a laxative recently.  He denies any abdominal pain denies vomiting denies fevers.  Has had a colonoscopy in the past that to his knowledge was normal. ? ? ?Abdominal Pain ? ?  ? ?Home Medications ?Prior to Admission medications   ?Medication Sig Start Date End Date Taking? Authorizing Provider  ?amLODipine-atorvastatin (CADUET) 10-40 MG tablet Take 1 tablet by mouth daily.    [provider]  ?cyclobenzaprine (FLEXERIL) 5 MG tablet Take 1 tablet (5 mg total) by mouth 3 (three) times daily as needed for muscle spasms. 10/31/15   Dorie Rank, MD  ?lidocaine (LIDODERM) 5 % Place 1 patch onto the skin daily. Remove & Discard patch within 12 hours or as directed by MD 11/15/21   Hendricks Limes, PA-C  ?lisinopril (PRINIVIL,ZESTRIL) 10 MG tablet Take 10 mg by mouth daily.    [provider]  ?traMADol (ULTRAM) 50 MG tablet Take 1 tablet (50 mg total) by mouth every 6 (six) hours as needed. 09/19/16   Gareth Morgan, MD  ?   ? ?Allergies    ?Penicillins and Propofol   ? ?Review of Systems   ?Review of Systems  ?Gastrointestinal:  Positive for abdominal pain.  ? ?Physical Exam ?Updated Vital Signs ?BP (!) 143/63   Pulse 69   Temp 98.3 ?F (36.8 ?C) (Oral)   Resp 15   Ht 5\' 6"  (1.676 m)   Wt 82.6 kg   SpO2 100%   BMI 29.38 kg/m?  ?Physical Exam ?Vitals and nursing note reviewed.  ?Constitutional:   ?    Appearance: He is well-developed.  ?HENT:  ?   Head: Normocephalic and atraumatic.  ?Eyes:  ?   Pupils: Pupils are equal, round, and reactive to light.  ?Neck:  ?   Vascular: No JVD.  ?Cardiovascular:  ?   Rate and Rhythm: Normal rate and regular rhythm.  ?   Heart sounds: No murmur heard. ?  No friction rub. No gallop.  ?Pulmonary:  ?   Effort: No respiratory distress.  ?   Breath sounds: No wheezing.  ?Abdominal:  ?   General: There is no distension.  ?   Tenderness: There is no abdominal tenderness. There is no guarding or rebound.  ?Genitourinary: ?   Comments: Mildly enlarged prostate without any significant tenderness or fluctuance.  No stool in the vault. ?Musculoskeletal:     ?   General: Normal range of motion.  ?   Cervical back: Normal range of motion and neck supple.  ?Skin: ?   Coloration: Skin is not pale.  ?   Findings: No rash.  ?Neurological:  ?   Mental Status: He is alert and oriented to person, place, and time.  ?Psychiatric:     ?   Behavior: Behavior normal.  ? ? ?ED Results /  Procedures / Treatments   ?Labs ?(all labs ordered are listed, but only abnormal results are displayed) ?Labs Reviewed  ?CBC WITH DIFFERENTIAL/PLATELET - Abnormal; Notable for the following components:  ?    Result Value  ? RBC 3.96 (*)   ? Hemoglobin 12.3 (*)   ? HCT 35.4 (*)   ? All other components within normal limits  ?COMPREHENSIVE METABOLIC PANEL - Abnormal; Notable for the following components:  ? CO2 19 (*)   ? BUN 30 (*)   ? Creatinine, Ser 1.97 (*)   ? Total Bilirubin 1.3 (*)   ? GFR, Estimated 35 (*)   ? All other components within normal limits  ?MAGNESIUM  ? ? ?EKG ?None ? ?Radiology ?No results found. ? ?Procedures ?Procedures  ? ? ?Medications Ordered in ED ?Medications  ?sodium chloride 0.9 % bolus 1,000 mL (1,000 mLs Intravenous New Bag/Given 12/09/21 2041)  ? ? ?ED Course/ Medical Decision Making/ A&P ?  ?                        ?Medical Decision Making ?Amount and/or Complexity of Data Reviewed ?Labs:  ordered. ? ? ?76 yo M with a chief complaints of feeling of pain underneath his testicles.  He attributes that to constipation.  Is been going on for a few months now.  He denies any urinary symptoms.  His prostate is mildly enlarged but I do not think significantly larger than I would expect for his age.  We will obtain a basic laboratory evaluation to assess for electrolyte abnormality that could cause his constipation.  We will have him follow-up with his family doctor in the office.  Follow-up with GI. ? ?Patient has not had blood work in our system in some time.  His renal function is mildly worse as well as he has a very mild anemia when compared to the last blood work.  He also has a metabolic acidosis that is of unknown etiology.  This is quite mild without anion gap.  I wonder if this could be due to frequent laxative use.  I will have him contact his family doctor to have those labs rechecked. ? ?9:12 PM:  I have discussed the diagnosis/risks/treatment options with the patient.  Evaluation and diagnostic testing in the emergency department does not suggest an emergent condition requiring admission or immediate intervention beyond what has been performed at this time.  They will follow up with  PCP. We also discussed returning to the ED immediately if new or worsening sx occur. We discussed the sx which are most concerning (e.g., sudden worsening pain, fever, inability to tolerate by mouth) that necessitate immediate return. Medications administered to the patient during their visit and any new prescriptions provided to the patient are listed below. ? ?Medications given during this visit ?Medications  ?sodium chloride 0.9 % bolus 1,000 mL (1,000 mLs Intravenous New Bag/Given 12/09/21 2041)  ? ? ? ?The patient appears reasonably screen and/or stabilized for discharge and I doubt any other medical condition or other [facility] requiring further screening, evaluation, or treatment in the ED at this time prior to  discharge.  ? ? ? ? ? ? ? ?Final Clinical Impression(s) / ED Diagnoses ?Final diagnoses:  ?Inguinal pain, unspecified laterality  ? ? ?Rx / DC Orders ?ED Discharge Orders   ? ? None  ? ?  ? ? ?  ?Deno Etienne, DO ?12/09/21 2112 ? ?

## 2021-12-09 NOTE — ED Triage Notes (Signed)
Pt is c/o abd pain that started about a week ago  Pt states he has been constipated  Took a laxative and had a bowel movement and then goes two or three days without one  Last BM was today  Pt states now he is having some pain in his belly and down in his private area   ?

## 2021-12-09 NOTE — Discharge Instructions (Signed)
Your blood work did not show any significant electrolyte abnormality.  What it did shows that your kidney function is mildly worse than it has been at least checked a few years ago in our system.  Please discuss this with your family doctor.  It could be due to dehydration as we had discussed in the room.  Typically I will have people try MiraLAX for constipation.  If you follow the instructions on the bottle it will take a few days to have a big bowel movement.  Please follow-up with your gastroenterologist.  They may choose to do another colonoscopy if it makes sense for you. ?

## 2022-11-23 ENCOUNTER — Encounter (HOSPITAL_BASED_OUTPATIENT_CLINIC_OR_DEPARTMENT_OTHER): Payer: Self-pay

## 2022-11-23 ENCOUNTER — Other Ambulatory Visit: Payer: Self-pay

## 2022-11-23 DIAGNOSIS — Z79899 Other long term (current) drug therapy: Secondary | ICD-10-CM | POA: Diagnosis not present

## 2022-11-23 DIAGNOSIS — I10 Essential (primary) hypertension: Secondary | ICD-10-CM | POA: Insufficient documentation

## 2022-11-23 DIAGNOSIS — K4090 Unilateral inguinal hernia, without obstruction or gangrene, not specified as recurrent: Secondary | ICD-10-CM | POA: Diagnosis not present

## 2022-11-23 DIAGNOSIS — K420 Umbilical hernia with obstruction, without gangrene: Secondary | ICD-10-CM | POA: Diagnosis not present

## 2022-11-23 DIAGNOSIS — R1084 Generalized abdominal pain: Secondary | ICD-10-CM | POA: Diagnosis present

## 2022-11-23 NOTE — ED Triage Notes (Signed)
Pt states he has been constipated for 3 days and this time meds not working.  Pt has taken Miralx, Ducolax and Metamucil to no avail.

## 2022-11-24 ENCOUNTER — Emergency Department (HOSPITAL_BASED_OUTPATIENT_CLINIC_OR_DEPARTMENT_OTHER)
Admission: EM | Admit: 2022-11-24 | Discharge: 2022-11-24 | Disposition: A | Discharge status: Home / Self Care | Payer: Medicare Other | Attending: Emergency Medicine | Admitting: Emergency Medicine

## 2022-11-24 ENCOUNTER — Emergency Department (HOSPITAL_BASED_OUTPATIENT_CLINIC_OR_DEPARTMENT_OTHER): Payer: Medicare Other

## 2022-11-24 DIAGNOSIS — K420 Umbilical hernia with obstruction, without gangrene: Secondary | ICD-10-CM

## 2022-11-24 DIAGNOSIS — K4090 Unilateral inguinal hernia, without obstruction or gangrene, not specified as recurrent: Secondary | ICD-10-CM

## 2022-11-24 LAB — BASIC METABOLIC PANEL
Anion gap: 10 (ref 5–15)
BUN: 24 mg/dL — ABNORMAL HIGH (ref 8–23)
CO2: 24 mmol/L (ref 22–32)
Calcium: 9.6 mg/dL (ref 8.9–10.3)
Chloride: 102 mmol/L (ref 98–111)
Creatinine, Ser: 1.65 mg/dL — ABNORMAL HIGH (ref 0.61–1.24)
GFR, Estimated: 43 mL/min — ABNORMAL LOW (ref >60)
Glucose, Bld: 118 mg/dL — ABNORMAL HIGH (ref 70–99)
Potassium: 5.2 mmol/L — ABNORMAL HIGH (ref 3.5–5.1)
Sodium: 136 mmol/L (ref 135–145)

## 2022-11-24 LAB — CBC WITH DIFFERENTIAL/PLATELET
Abs Immature Granulocytes: 0.02 10*3/uL (ref 0.00–0.07)
Basophils Absolute: 0 10*3/uL (ref 0.0–0.1)
Basophils Relative: 0 %
Eosinophils Absolute: 0 10*3/uL (ref 0.0–0.5)
Eosinophils Relative: 1 %
HCT: 39.7 % (ref 39.0–52.0)
Hemoglobin: 13.7 g/dL (ref 13.0–17.0)
Immature Granulocytes: 0 %
Lymphocytes Relative: 20 %
Lymphs Abs: 1.4 10*3/uL (ref 0.7–4.0)
MCH: 32.2 pg (ref 26.0–34.0)
MCHC: 34.5 g/dL (ref 30.0–36.0)
MCV: 93.4 fL (ref 80.0–100.0)
Monocytes Absolute: 0.5 10*3/uL (ref 0.1–1.0)
Monocytes Relative: 7 %
Neutro Abs: 5.1 10*3/uL (ref 1.7–7.7)
Neutrophils Relative %: 72 %
Platelets: 269 10*3/uL (ref 150–400)
RBC: 4.25 MIL/uL (ref 4.22–5.81)
RDW: 12.6 % (ref 11.5–15.5)
WBC: 7.1 10*3/uL (ref 4.0–10.5)
nRBC: 0 % (ref 0.0–0.2)

## 2022-11-24 MED ORDER — ONDANSETRON HCL 4 MG/2ML IJ SOLN
4.0000 mg | Freq: Once | INTRAMUSCULAR | Status: AC
  Administered 2022-11-24: 4 mg via INTRAVENOUS
  Filled 2022-11-24: qty 2

## 2022-11-24 MED ORDER — FENTANYL CITRATE PF 50 MCG/ML IJ SOSY
50.0000 ug | PREFILLED_SYRINGE | Freq: Once | INTRAMUSCULAR | Status: AC
  Administered 2022-11-24: 50 ug via INTRAVENOUS
  Filled 2022-11-24: qty 1

## 2022-11-24 MED ORDER — IOHEXOL 300 MG/ML  SOLN
80.0000 mL | Freq: Once | INTRAMUSCULAR | Status: AC | PRN
  Administered 2022-11-24: 80 mL via INTRAVENOUS

## 2022-11-24 NOTE — Discharge Instructions (Signed)
You were seen in the emergency department with 2 hernias and a developing small bowel obstruction.  This has potential to turn into a surgical emergency and we strongly recommended that you be transferred to the [facility] emergency department for immediate surgical evaluation.  You have declined this and are leaving [place].  If you change your mind please go directly to the [facility] emergency department without eating or drinking.  They can see you there and have evaluated by surgery.  You may also return to this department should you change your mind and I can rearrange transport.  Without treatment I do expect the symptoms to continue and possibly worsen.

## 2022-11-24 NOTE — ED Notes (Signed)
Pt signed AMA form. Explained potential risks involved with decision. Pt states understanding. No pain at this time

## 2022-11-24 NOTE — ED Provider Notes (Signed)
Blood pressure (!) 155/75, pulse 76, temperature 98.3 F (36.8 C), temperature source Oral, resp. rate 20, height 5\' 6"  (1.676 m), weight 68 kg, SpO2 99 %.  Assuming care from Dr. Florina Ou.  In short, Marc Vargas is a 77 y.o. male with a chief complaint of Abdominal Pain .  Refer to the original H&P for additional details.  The current plan of care is to follow up on CT and reassess.  08:54 AM  Patient CT scan concerning for early bowel obstruction with hernias at 2 sites.  These areas are soft and not particularly tender to touch.  Unable to reduce them but then they come back out almost immediately.  He is comfortable.  Vital signs within normal limits.  I discussed with Kathlee Nations with the surgery team at [facility] and arranged for immediate transfer to the [facility] emergency department for surgery evaluation and decision regarding medical versus surgical management.   And updating the patient regarding this he states that he wishes to go home.  He does not want to seek surgery evaluation today.  We discussed that this is potentially serious and if obstruction continues he will undoubtedly get worse and could experience rupture of his small intestine which would lead to severe infection and much more extensive surgery where this could be prevented by transfer.  He states he understands this but would like to return home and discuss it with his family prior to proceeding.  I offered both private vehicle and ambulance transport for ease but patient refuses.  He does not appear confused and appears to have capacity to make this decision.  He understands that he will be signing out [place] and that my strong suggestion is that he go immediately to the [facility] emergency department where they are expecting him.  Discussed that he is also welcome to return here should symptoms worsen.   Margette Fast, MD 11/24/22 [phone]

## 2022-11-24 NOTE — ED Provider Notes (Signed)
[facility] Provider Note: Georgena Spurling, MD, FACEP  CSN: [id number] MRN: [id number] ARRIVAL: 11/23/22 at 2258 ROOM: [place] [facility]  Abdominal Pain   HISTORY OF PRESENT ILLNESS  11/24/22 5:12 AM Marc Vargas is a 77 y.o. male who has been constipated for 3 days.  He has tried MiraLAX, Dulcolax and Metamucil without relief.  He is having generalized abdominal pain which he rates as a 10 out of 10 and it is located primarily in the lower abdomen.  He has a left inguinal hernia that has been present for about a year.  He has had 1 episode of vomiting.   Past Medical History:  Diagnosis Date   Hypertension    Renal disorder     Past Surgical History:  Procedure Laterality Date   SHOULDER SURGERY     TRANSURETHRAL RESECTION OF PROSTATE      History reviewed. No pertinent family history.  Social History   Tobacco Use   Smoking status: Never   Smokeless tobacco: Never  Vaping Use   Vaping Use: Never used  Substance Use Topics   Alcohol use: No   Drug use: No    Prior to Admission medications   Medication Sig Start Date End Date Taking? Authorizing Provider  amLODipine-atorvastatin (CADUET) 10-40 MG tablet Take 1 tablet by mouth daily.    [provider]  cyclobenzaprine (FLEXERIL) 5 MG tablet Take 1 tablet (5 mg total) by mouth 3 (three) times daily as needed for muscle spasms. 10/31/15   Dorie Rank, MD  lidocaine (LIDODERM) 5 % Place 1 patch onto the skin daily. Remove & Discard patch within 12 hours or as directed by MD 11/15/21   Hendricks Limes, PA-C  lisinopril (PRINIVIL,ZESTRIL) 10 MG tablet Take 10 mg by mouth daily.    [provider]  traMADol (ULTRAM) 50 MG tablet Take 1 tablet (50 mg total) by mouth every 6 (six) hours as needed. 09/19/16   Gareth Morgan, MD    Allergies Penicillins and Propofol   REVIEW OF SYSTEMS  Negative except as noted here or in the History of Present Illness.   PHYSICAL EXAMINATION   Initial Vital Signs Blood pressure (!) 155/75, pulse 76, temperature 98.3 F (36.8 C), temperature source Oral, resp. rate 20, height 5\' 6"  (1.676 m), weight 68 kg, SpO2 99 %.  Examination General: Well-developed, cachectic male in no acute distress; appearance consistent with age of record HENT: normocephalic; atraumatic Eyes: Normal appearance Neck: supple Heart: regular rate and rhythm Lungs: clear to auscultation bilaterally Abdomen: soft; nondistended; lower abdominal tenderness; bowel sounds present GU: Tanner V male; large left inguinal hernia Rectal: Normal sphincter tone; mildly stenotic ring in anal vault; no stool in vault Extremities: No deformity; full range of motion; pulses normal Neurologic: Awake, alert and oriented; motor function intact in all extremities and symmetric; no facial droop Skin: Warm and dry Psychiatric: Normal mood and affect   RESULTS  Summary of this visit's results, reviewed and interpreted by myself:   EKG Interpretation  Date/Time:    Ventricular Rate:    PR Interval:    QRS Duration:   QT Interval:    QTC Calculation:   R Axis:     Text Interpretation:         Laboratory Studies: Results for orders placed or performed during the hospital encounter of 11/24/22 (from the past 24 hour(s))  CBC with Differential     Status: None   Collection Time: 11/24/22  6:14 AM  Result Value Ref Range   WBC 7.1 4.0 - 10.5 K/uL   RBC 4.25 4.22 - 5.81 MIL/uL   Hemoglobin 13.7 13.0 - 17.0 g/dL   HCT 39.7 39.0 - 52.0 %   MCV 93.4 80.0 - 100.0 fL   MCH 32.2 26.0 - 34.0 pg   MCHC 34.5 30.0 - 36.0 g/dL   RDW 12.6 11.5 - 15.5 %   Platelets 269 150 - 400 K/uL   nRBC 0.0 0.0 - 0.2 %   Neutrophils Relative % 72 %   Neutro Abs 5.1 1.7 - 7.7 K/uL   Lymphocytes Relative 20 %   Lymphs Abs 1.4 0.7 - 4.0 K/uL   Monocytes Relative 7 %   Monocytes Absolute 0.5 0.1 - 1.0 K/uL   Eosinophils Relative 1 %   Eosinophils Absolute 0.0 0.0 - 0.5 K/uL    Basophils Relative 0 %   Basophils Absolute 0.0 0.0 - 0.1 K/uL   Immature Granulocytes 0 %   Abs Immature Granulocytes 0.02 0.00 - 0.07 K/uL  Basic metabolic panel     Status: Abnormal   Collection Time: 11/24/22  6:14 AM  Result Value Ref Range   Sodium 136 135 - 145 mmol/L   Potassium 5.2 (H) 3.5 - 5.1 mmol/L   Chloride 102 98 - 111 mmol/L   CO2 24 22 - 32 mmol/L   Glucose, Bld 118 (H) 70 - 99 mg/dL   BUN 24 (H) 8 - 23 mg/dL   Creatinine, Ser 1.65 (H) 0.61 - 1.24 mg/dL   Calcium 9.6 8.9 - 10.3 mg/dL   GFR, Estimated 43 (L) >60 mL/min   Anion gap 10 5 - 15   Imaging Studies: CT ABDOMEN PELVIS W CONTRAST  Result Date: 11/24/2022 CLINICAL DATA:  77 year old male with history of constipation for 3 days. Suspected bowel obstruction. Possible left inguinal hernia. EXAM: CT ABDOMEN AND PELVIS WITH CONTRAST TECHNIQUE: Multidetector CT imaging of the abdomen and pelvis was performed using the standard protocol following bolus administration of intravenous contrast. RADIATION DOSE REDUCTION: This exam was performed according to the departmental dose-optimization program which includes automated exposure control, adjustment of the mA and/or kV according to patient size and/or use of iterative reconstruction technique. CONTRAST:  26mL OMNIPAQUE IOHEXOL 300 MG/ML  SOLN COMPARISON:  No priors. FINDINGS: Lower chest: Atherosclerotic calcifications in the descending thoracic aorta as well as the left anterior descending, left circumflex and right coronary arteries. Hepatobiliary: No suspicious cystic or solid hepatic lesions. No intra or extrahepatic biliary ductal dilatation. Gallbladder is unremarkable in appearance. Pancreas: No pancreatic mass. No pancreatic ductal dilatation. No pancreatic or peripancreatic fluid collections or inflammatory changes. Spleen: Unremarkable. Adrenals/Urinary Tract: Numerous well-defined low-attenuation lesions in both kidneys compatible with simple cysts, largest of which is  exophytic in the lower pole of the right kidney measuring 5.1 cm in diameter. In addition, there is an exophytic centrally low-attenuation lesion with some mural thickening and mild mural enhancement in the upper pole of the right kidney measuring 7.0 x 6.7 cm (axial image 26 of series 2) with a 1 cm enhancing mural nodule (axial image 26 of series 2), concerning for potential cystic renal neoplasm. No hydroureteronephrosis. Urinary bladder is unremarkable in appearance. Bilateral adrenal glands are normal in appearance. Stomach/Bowel: The appearance of the stomach is normal. Several prominent mildly dilated loops of mid small bowel are noted (likely jejunum) measuring up to 3.2 cm in diameter, with some small air-fluid levels. Distal small bowel is normal in caliber  and filled with fluid. Small umbilical hernia which appears to contain a very short segment of mid to distal small bowel. Normal caliber colon filled with predominantly liquid stool. Left inguinoscrotal hernia containing a long segment of the proximal sigmoid colon. Vascular/Lymphatic: Aortic atherosclerosis, without evidence of aneurysm or dissection in the abdominal or pelvic vasculature. No lymphadenopathy noted in the abdomen or pelvis. Reproductive: Prostate gland and seminal vesicles are unremarkable in appearance. Large left hydrocele. Other: No significant volume of ascites.  No pneumoperitoneum. Musculoskeletal: There are no aggressive appearing lytic or blastic lesions noted in the visualized portions of the skeleton. IMPRESSION: 1. Findings are concerning for early or partial small bowel obstruction, likely related to entrapment of a short segment of small bowel in the patient's umbilical hernia. Surgical consultation is recommended. 2. There is also a large left inguinoscrotal hernia containing a significant portion of the proximal sigmoid colon, without findings to suggest obstruction at this level. 3. Left-sided hydrocele. 4. Aortic  atherosclerosis, in addition to three-vessel coronary artery disease. Assessment for potential risk factor modification, dietary therapy or pharmacologic therapy may be warranted, if clinically indicated. 5. Additional incidental findings, as above. Electronically Signed   By: Vinnie Langton M.D.   On: 11/24/2022 07:34    ED COURSE and MDM  Nursing notes, initial and subsequent vitals signs, including pulse oximetry, reviewed and interpreted by myself.  Vitals:   11/24/22 0748 11/24/22 0815 11/24/22 0834 11/24/22 0845  BP: (!) 155/75 (!) 144/71  (!) 140/73  Pulse: 66 (!) 59  (!) 59  Resp: 18   16  Temp:   98.1 F (36.7 C) 98.1 F (36.7 C)  TempSrc:   Oral   SpO2: 100% 100%  100%  Weight:      Height:       Medications  ondansetron (ZOFRAN) injection 4 mg (4 mg Intravenous Given 11/24/22 0558)  fentaNYL (SUBLIMAZE) injection 50 mcg (50 mcg Intravenous Given 11/24/22 0558)  iohexol (OMNIPAQUE) 300 MG/ML solution 80 mL (80 mLs Intravenous Contrast Given 11/24/22 0656)   7:00 AM Signed out to Dr. Laverta Baltimore. CT Abd/Pelvis pending. Suspect bowel obstruction due to left inguinal hernia.    PROCEDURES  Procedures   ED DIAGNOSES     ICD-10-CM   1. Umbilical hernia with obstruction, without gangrene  K42.0     2. Left inguinal hernia  K40.90          Jillyn Stacey, MD 11/24/22 1039

## 2022-11-25 ENCOUNTER — Other Ambulatory Visit: Payer: Self-pay

## 2022-11-25 ENCOUNTER — Emergency Department (HOSPITAL_COMMUNITY)
Admission: EM | Admit: 2022-11-25 | Discharge: 2022-11-25 | Disposition: A | Discharge status: Home / Self Care | Payer: Medicare Other | Attending: Emergency Medicine | Admitting: Emergency Medicine

## 2022-11-25 ENCOUNTER — Encounter (HOSPITAL_COMMUNITY): Payer: Self-pay | Admitting: Pharmacy Technician

## 2022-11-25 DIAGNOSIS — Z79899 Other long term (current) drug therapy: Secondary | ICD-10-CM | POA: Insufficient documentation

## 2022-11-25 DIAGNOSIS — N281 Cyst of kidney, acquired: Secondary | ICD-10-CM | POA: Diagnosis not present

## 2022-11-25 DIAGNOSIS — R1033 Periumbilical pain: Secondary | ICD-10-CM | POA: Diagnosis present

## 2022-11-25 DIAGNOSIS — R748 Abnormal levels of other serum enzymes: Secondary | ICD-10-CM | POA: Diagnosis not present

## 2022-11-25 DIAGNOSIS — K567 Ileus, unspecified: Secondary | ICD-10-CM | POA: Diagnosis not present

## 2022-11-25 DIAGNOSIS — K4090 Unilateral inguinal hernia, without obstruction or gangrene, not specified as recurrent: Secondary | ICD-10-CM | POA: Insufficient documentation

## 2022-11-25 DIAGNOSIS — I10 Essential (primary) hypertension: Secondary | ICD-10-CM | POA: Diagnosis not present

## 2022-11-25 LAB — COMPREHENSIVE METABOLIC PANEL
ALT: 16 U/L (ref 0–44)
AST: 18 U/L (ref 15–41)
Albumin: 4.2 g/dL (ref 3.5–5.0)
Alkaline Phosphatase: 70 U/L (ref 38–126)
Anion gap: 8 (ref 5–15)
BUN: 35 mg/dL — ABNORMAL HIGH (ref 8–23)
CO2: 24 mmol/L (ref 22–32)
Calcium: 9 mg/dL (ref 8.9–10.3)
Chloride: 103 mmol/L (ref 98–111)
Creatinine, Ser: 2.19 mg/dL — ABNORMAL HIGH (ref 0.61–1.24)
GFR, Estimated: 30 mL/min — ABNORMAL LOW (ref >60)
Glucose, Bld: 99 mg/dL (ref 70–99)
Potassium: 4.7 mmol/L (ref 3.5–5.1)
Sodium: 135 mmol/L (ref 135–145)
Total Bilirubin: 1.1 mg/dL (ref 0.3–1.2)
Total Protein: 6.7 g/dL (ref 6.5–8.1)

## 2022-11-25 LAB — URINALYSIS, ROUTINE W REFLEX MICROSCOPIC
Bilirubin Urine: NEGATIVE
Glucose, UA: NEGATIVE mg/dL
Hgb urine dipstick: NEGATIVE
Ketones, ur: NEGATIVE mg/dL
Leukocytes,Ua: NEGATIVE
Nitrite: NEGATIVE
Protein, ur: NEGATIVE mg/dL
Specific Gravity, Urine: 1.015 (ref 1.005–1.030)
pH: 6 (ref 5.0–8.0)

## 2022-11-25 LAB — CBC
HCT: 36.8 % — ABNORMAL LOW (ref 39.0–52.0)
Hemoglobin: 12.3 g/dL — ABNORMAL LOW (ref 13.0–17.0)
MCH: 31.9 pg (ref 26.0–34.0)
MCHC: 33.4 g/dL (ref 30.0–36.0)
MCV: 95.6 fL (ref 80.0–100.0)
Platelets: 247 10*3/uL (ref 150–400)
RBC: 3.85 MIL/uL — ABNORMAL LOW (ref 4.22–5.81)
RDW: 12.8 % (ref 11.5–15.5)
WBC: 5.3 10*3/uL (ref 4.0–10.5)
nRBC: 0 % (ref 0.0–0.2)

## 2022-11-25 LAB — LIPASE, BLOOD: Lipase: 54 U/L — ABNORMAL HIGH (ref 11–51)

## 2022-11-25 MED ORDER — POLYETHYLENE GLYCOL 3350 17 G PO PACK: 17.0000 g | PACK | Freq: Every day | ORAL | 0 refills | Status: DC

## 2022-11-25 MED ORDER — LACTATED RINGERS IV SOLN: INTRAVENOUS | Status: DC

## 2022-11-25 MED ORDER — LACTATED RINGERS IV BOLUS
1000.0000 mL | Freq: Once | INTRAVENOUS | Status: AC
  Administered 2022-11-25: 1000 mL via INTRAVENOUS

## 2022-11-25 NOTE — Discharge Instructions (Addendum)
You have hernia to the abdomen. Take miralax 1-2 times daily to avoid getting constipated and having to strain to have bowel movements. You can also find a hernia belt or truss which may be helpful in keeping your inguinal hernia from coming out as often.  Additionally, you have renal cyst that will need your primary care doctor to assess to ensure that it is benign and not concerning for a tumor.

## 2022-11-25 NOTE — ED Notes (Signed)
Pt able to tolerate POs without issue

## 2022-11-25 NOTE — Consult Note (Signed)
Consult Note  Marc Vargas [birth year]  [id number].    Requesting MD: Varney Biles, MD Chief Complaint/Reason for Consult: hernias  HPI:  Patient is a 77 year old male who presented to the ED initially yesterday at [facility] with constipation and abdominal pain. He had a known LIH present for 1 year and reported severe lower abdominal pain yesterday. He reported 1 episode of emesis. On CT scan yesterday noted to have umbilical hernia containing loop of small bowel and LIH containing proximal sigmoid colon. General surgery called and we recommended transfer to [facility] for surgical evaluation. Patient left the ED AMA. Today he returns for the same. He reports he had a small BM this AM and he was able to tolerate breakfast this AM without nausea or vomiting. He reported to me that hernias have been present in the last month. He frequently has to take laxatives to have bowel movements. He denies difficulty with urination. No prior abdominal surgery. No blood thinners. PMH otherwise significant for HTN and kidney dysfunction. He reports intolerances to PCNs and propofol with reactions of nausea and chills. He works at [employer]. He denies tobacco, alcohol or illicit drug use.   ROS: Negative other than HPI  History reviewed. No pertinent family history.  Past Medical History:  Diagnosis Date   Hypertension    Renal disorder     Past Surgical History:  Procedure Laterality Date   SHOULDER SURGERY     TRANSURETHRAL RESECTION OF PROSTATE      Social History:  reports that he has never smoked. He has never used smokeless tobacco. He reports that he does not drink alcohol and does not use drugs.  Allergies:  Allergies  Allergen Reactions   Penicillins Other (See Comments)    Headache, and multiple other complaints   Propofol     (Not in a hospital admission)   Blood pressure (!) 160/88, pulse 75, temperature 98.4 F (36.9 C), temperature source Oral, resp. rate 18, SpO2 100  %. Physical Exam:  General: pleasant, WD, WN male who is laying in bed in NAD HEENT: head is normocephalic, atraumatic.  Sclera are noninjected.  EOMI.  Ears and nose without any masses or lesions.  Mouth is pink and moist Heart: regular, rate, and rhythm.   Lungs: Respiratory effort nonlabored Abd: soft, NT, ND, umbilical hernia small and reduced, LIH reduced in ED  MS: all 4 extremities are symmetrical with no cyanosis, clubbing, or edema. Skin: warm and dry with no masses, lesions, or rashes Neuro: Cranial nerves 2-12 grossly intact, sensation is normal throughout Psych: A&Ox3 with an appropriate affect.   Results for orders placed or performed during the hospital encounter of 11/25/22 (from the past 48 hour(s))  Lipase, blood     Status: Abnormal   Collection Time: 11/25/22 11:35 AM  Result Value Ref Range   Lipase 54 (H) 11 - 51 U/L    Comment: Performed at [facility], [place] [address], [place], [place] [address]  Comprehensive metabolic panel     Status: Abnormal   Collection Time: 11/25/22 11:35 AM  Result Value Ref Range   Sodium 135 135 - 145 mmol/L   Potassium 4.7 3.5 - 5.1 mmol/L   Chloride 103 98 - 111 mmol/L   CO2 24 22 - 32 mmol/L   Glucose, Bld 99 70 - 99 mg/dL    Comment: Glucose reference range applies only to samples taken after fasting for at least 8 hours.  BUN 35 (H) 8 - 23 mg/dL   Creatinine, Ser 2.19 (H) 0.61 - 1.24 mg/dL   Calcium 9.0 8.9 - 10.3 mg/dL   Total Protein 6.7 6.5 - 8.1 g/dL   Albumin 4.2 3.5 - 5.0 g/dL   AST 18 15 - 41 U/L   ALT 16 0 - 44 U/L   Alkaline Phosphatase 70 38 - 126 U/L   Total Bilirubin 1.1 0.3 - 1.2 mg/dL   GFR, Estimated 30 (L) >60 mL/min    Comment: (NOTE) Calculated using the CKD-EPI Creatinine Equation (2021)    Anion gap 8 5 - 15    Comment: Performed at [facility], [place] [address], [place], [place] [address]  CBC     Status: Abnormal   Collection Time: 11/25/22 11:35 AM  Result  Value Ref Range   WBC 5.3 4.0 - 10.5 K/uL   RBC 3.85 (L) 4.22 - 5.81 MIL/uL   Hemoglobin 12.3 (L) 13.0 - 17.0 g/dL   HCT 36.8 (L) 39.0 - 52.0 %   MCV 95.6 80.0 - 100.0 fL   MCH 31.9 26.0 - 34.0 pg   MCHC 33.4 30.0 - 36.0 g/dL   RDW 12.8 11.5 - 15.5 %   Platelets 247 150 - 400 K/uL   nRBC 0.0 0.0 - 0.2 %    Comment: Performed at [facility], [place] [address], [place], [place] [address]  Urinalysis, Routine w reflex microscopic -Urine, Clean Catch     Status: None   Collection Time: 11/25/22 11:35 AM  Result Value Ref Range   Color, Urine YELLOW YELLOW   APPearance CLEAR CLEAR   Specific Gravity, Urine 1.015 1.005 - 1.030   pH 6.0 5.0 - 8.0   Glucose, UA NEGATIVE NEGATIVE mg/dL   Hgb urine dipstick NEGATIVE NEGATIVE   Bilirubin Urine NEGATIVE NEGATIVE   Ketones, ur NEGATIVE NEGATIVE mg/dL   Protein, ur NEGATIVE NEGATIVE mg/dL   Nitrite NEGATIVE NEGATIVE   Leukocytes,Ua NEGATIVE NEGATIVE    Comment: Performed at [facility], [place] [address] [place],  [address]   CT ABDOMEN PELVIS W CONTRAST  Result Date: 11/24/2022 CLINICAL DATA:  77 year old male with history of constipation for 3 days. Suspected bowel obstruction. Possible left inguinal hernia. EXAM: CT ABDOMEN AND PELVIS WITH CONTRAST TECHNIQUE: Multidetector CT imaging of the abdomen and pelvis was performed using the standard protocol following bolus administration of intravenous contrast. RADIATION DOSE REDUCTION: This exam was performed according to the departmental dose-optimization program which includes automated exposure control, adjustment of the mA and/or kV according to patient size and/or use of iterative reconstruction technique. CONTRAST:  39mL OMNIPAQUE IOHEXOL 300 MG/ML  SOLN COMPARISON:  No priors. FINDINGS: Lower chest: Atherosclerotic calcifications in the descending thoracic aorta as well as the left anterior descending, left circumflex and right coronary arteries.  Hepatobiliary: No suspicious cystic or solid hepatic lesions. No intra or extrahepatic biliary ductal dilatation. Gallbladder is unremarkable in appearance. Pancreas: No pancreatic mass. No pancreatic ductal dilatation. No pancreatic or peripancreatic fluid collections or inflammatory changes. Spleen: Unremarkable. Adrenals/Urinary Tract: Numerous well-defined low-attenuation lesions in both kidneys compatible with simple cysts, largest of which is exophytic in the lower pole of the right kidney measuring 5.1 cm in diameter. In addition, there is an exophytic centrally low-attenuation lesion with some mural thickening and mild mural enhancement in the upper pole of the right kidney measuring 7.0 x 6.7 cm (axial image 26 of series 2) with a 1 cm enhancing mural nodule (  axial image 26 of series 2), concerning for potential cystic renal neoplasm. No hydroureteronephrosis. Urinary bladder is unremarkable in appearance. Bilateral adrenal glands are normal in appearance. Stomach/Bowel: The appearance of the stomach is normal. Several prominent mildly dilated loops of mid small bowel are noted (likely jejunum) measuring up to 3.2 cm in diameter, with some small air-fluid levels. Distal small bowel is normal in caliber and filled with fluid. Small umbilical hernia which appears to contain a very short segment of mid to distal small bowel. Normal caliber colon filled with predominantly liquid stool. Left inguinoscrotal hernia containing a long segment of the proximal sigmoid colon. Vascular/Lymphatic: Aortic atherosclerosis, without evidence of aneurysm or dissection in the abdominal or pelvic vasculature. No lymphadenopathy noted in the abdomen or pelvis. Reproductive: Prostate gland and seminal vesicles are unremarkable in appearance. Large left hydrocele. Other: No significant volume of ascites.  No pneumoperitoneum. Musculoskeletal: There are no aggressive appearing lytic or blastic lesions noted in the visualized  portions of the skeleton. IMPRESSION: 1. Findings are concerning for early or partial small bowel obstruction, likely related to entrapment of a short segment of small bowel in the patient's umbilical hernia. Surgical consultation is recommended. 2. There is also a large left inguinoscrotal hernia containing a significant portion of the proximal sigmoid colon, without findings to suggest obstruction at this level. 3. Left-sided hydrocele. 4. Aortic atherosclerosis, in addition to three-vessel coronary artery disease. Assessment for potential risk factor modification, dietary therapy or pharmacologic therapy may be warranted, if clinically indicated. 5. Additional incidental findings, as above. Electronically Signed   By: Vinnie Langton M.D.   On: 11/24/2022 07:34      Assessment/Plan Umbilical and left inguinal hernias - not clinically obstructed and both hernias reduced - patient offered surgery today vs tomorrow morning and prefers to go home and see provider in the office next week to discuss further. Appointment arranged and entered into AVS. - discussed with ED provider who will ensure patient is able to tolerate PO intake and discharge home if able - if patient changes his mind and would like to proceed with surgery this hospital visit, please let general surgery know    I reviewed ED provider notes, last 24 h vitals and pain scores, last 48 h intake and output, last 24 h labs and trends, and last 24 h imaging results.   Norm Parcel, [facility] 11/25/2022, 1:55 PM Please see Amion for pager number during day hours 7:00am-4:30pm

## 2022-11-25 NOTE — ED Triage Notes (Signed)
Pt here with worsening abdominal pain over the last few days. Pt left AMA for same yesterday.

## 2022-11-25 NOTE — ED Provider Notes (Signed)
[place] EMERGENCY DEPARTMENT AT [facility] Provider Note   CSN: [id number] Arrival date & time: 11/25/22  1105     History  Chief Complaint  Patient presents with   Abdominal Pain    ICHOLAS WEARE is a 77 y.o. male.  HPI    77 year old male comes in with chief complaint of abdominal pain.  Patient has history of hypertension and known history of hernia.  He indicates that he has been having some abdominal discomfort over the last week.  Over the past few couple days patient had noted increased swelling in the groin region, and he came to the emergency room yesterday.  He had a CT scan done that showed evidence of early small bowel obstruction.  Patient was advised to come to the [facility], ER for surgery evaluation, but he left AMA.  After going home, patient called general surgery team for an appointment, however the appointment was going to be provided not until late April.  Patient decided to come to the ER for further assessment therefore.  He states that he continues to have abdominal pain.  He did have something to eat earlier today and was able to tolerate it.  He also had a small bowel movement today and continues to pass flatus.  The pain however is moderately severe in the inguinal region.   Home Medications Prior to Admission medications   Medication Sig Start Date End Date Taking? Authorizing Provider  polyethylene glycol (MIRALAX / GLYCOLAX) 17 g packet Take 17 g by mouth daily. 11/25/22  Yes Dyesha Henault, MD  amLODipine-atorvastatin (CADUET) 10-40 MG tablet Take 1 tablet by mouth daily.    [provider]  cyclobenzaprine (FLEXERIL) 5 MG tablet Take 1 tablet (5 mg total) by mouth 3 (three) times daily as needed for muscle spasms. 10/31/15   Dorie Rank, MD  lidocaine (LIDODERM) 5 % Place 1 patch onto the skin daily. Remove & Discard patch within 12 hours or as directed by MD 11/15/21   Hendricks Limes, PA-C  lisinopril (PRINIVIL,ZESTRIL) 10 MG  tablet Take 10 mg by mouth daily.    [provider]  traMADol (ULTRAM) 50 MG tablet Take 1 tablet (50 mg total) by mouth every 6 (six) hours as needed. 09/19/16   Gareth Morgan, MD      Allergies    Penicillins and Propofol    Review of Systems   Review of Systems  All other systems reviewed and are negative.   Physical Exam Updated Vital Signs BP (!) 160/88 (BP Location: Left Arm)   Pulse 75   Temp 98.4 F (36.9 C) (Oral)   Resp 18   SpO2 100%  Physical Exam Vitals and nursing note reviewed.  Constitutional:      Appearance: He is well-developed.  HENT:     Head: Atraumatic.  Cardiovascular:     Rate and Rhythm: Normal rate.  Pulmonary:     Effort: Pulmonary effort is normal.  Abdominal:     Tenderness: There is abdominal tenderness.     Hernia: A hernia is present. Hernia is present in the umbilical area and left inguinal area.     Comments: Unable to reduce hernia over the left inguinal region  Musculoskeletal:     Cervical back: Neck supple.  Skin:    General: Skin is warm.  Neurological:     Mental Status: He is alert and oriented to person, place, and time.     ED Results / Procedures /  Treatments   Labs (all labs ordered are listed, but only abnormal results are displayed) Labs Reviewed  LIPASE, BLOOD - Abnormal; Notable for the following components:      Result Value   Lipase 54 (*)    All other components within normal limits  COMPREHENSIVE METABOLIC PANEL - Abnormal; Notable for the following components:   BUN 35 (*)    Creatinine, Ser 2.19 (*)    GFR, Estimated 30 (*)    All other components within normal limits  CBC - Abnormal; Notable for the following components:   RBC 3.85 (*)    Hemoglobin 12.3 (*)    HCT 36.8 (*)    All other components within normal limits  URINALYSIS, ROUTINE W REFLEX MICROSCOPIC    EKG None  Radiology CT ABDOMEN PELVIS W CONTRAST  Result Date: 11/24/2022 CLINICAL DATA:  77 year old male with history  of constipation for 3 days. Suspected bowel obstruction. Possible left inguinal hernia. EXAM: CT ABDOMEN AND PELVIS WITH CONTRAST TECHNIQUE: Multidetector CT imaging of the abdomen and pelvis was performed using the standard protocol following bolus administration of intravenous contrast. RADIATION DOSE REDUCTION: This exam was performed according to the departmental dose-optimization program which includes automated exposure control, adjustment of the mA and/or kV according to patient size and/or use of iterative reconstruction technique. CONTRAST:  47mL OMNIPAQUE IOHEXOL 300 MG/ML  SOLN COMPARISON:  No priors. FINDINGS: Lower chest: Atherosclerotic calcifications in the descending thoracic aorta as well as the left anterior descending, left circumflex and right coronary arteries. Hepatobiliary: No suspicious cystic or solid hepatic lesions. No intra or extrahepatic biliary ductal dilatation. Gallbladder is unremarkable in appearance. Pancreas: No pancreatic mass. No pancreatic ductal dilatation. No pancreatic or peripancreatic fluid collections or inflammatory changes. Spleen: Unremarkable. Adrenals/Urinary Tract: Numerous well-defined low-attenuation lesions in both kidneys compatible with simple cysts, largest of which is exophytic in the lower pole of the right kidney measuring 5.1 cm in diameter. In addition, there is an exophytic centrally low-attenuation lesion with some mural thickening and mild mural enhancement in the upper pole of the right kidney measuring 7.0 x 6.7 cm (axial image 26 of series 2) with a 1 cm enhancing mural nodule (axial image 26 of series 2), concerning for potential cystic renal neoplasm. No hydroureteronephrosis. Urinary bladder is unremarkable in appearance. Bilateral adrenal glands are normal in appearance. Stomach/Bowel: The appearance of the stomach is normal. Several prominent mildly dilated loops of mid small bowel are noted (likely jejunum) measuring up to 3.2 cm in diameter,  with some small air-fluid levels. Distal small bowel is normal in caliber and filled with fluid. Small umbilical hernia which appears to contain a very short segment of mid to distal small bowel. Normal caliber colon filled with predominantly liquid stool. Left inguinoscrotal hernia containing a long segment of the proximal sigmoid colon. Vascular/Lymphatic: Aortic atherosclerosis, without evidence of aneurysm or dissection in the abdominal or pelvic vasculature. No lymphadenopathy noted in the abdomen or pelvis. Reproductive: Prostate gland and seminal vesicles are unremarkable in appearance. Large left hydrocele. Other: No significant volume of ascites.  No pneumoperitoneum. Musculoskeletal: There are no aggressive appearing lytic or blastic lesions noted in the visualized portions of the skeleton. IMPRESSION: 1. Findings are concerning for early or partial small bowel obstruction, likely related to entrapment of a short segment of small bowel in the patient's umbilical hernia. Surgical consultation is recommended. 2. There is also a large left inguinoscrotal hernia containing a significant portion of the proximal sigmoid colon, without findings to  suggest obstruction at this level. 3. Left-sided hydrocele. 4. Aortic atherosclerosis, in addition to three-vessel coronary artery disease. Assessment for potential risk factor modification, dietary therapy or pharmacologic therapy may be warranted, if clinically indicated. 5. Additional incidental findings, as above. Electronically Signed   By: Vinnie Langton M.D.   On: 11/24/2022 07:34    Procedures Procedures    Medications Ordered in ED Medications  lactated ringers bolus 1,000 mL (1,000 mLs Intravenous New Bag/Given 11/25/22 1300)    ED Course/ Medical Decision Making/ A&P                             Medical Decision Making Amount and/or Complexity of Data Reviewed Labs: ordered.   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    The differential diagnosis includes : Complications from hernia that include small obstruction, severe electrolyte abnormality.  Based on history, patient states that he was able to tolerate p.o. and had a bowel movement earlier today and is passing flatus.  Clinically does not appear that he is having complete obstruction.  His abdominal exam is reassuring besides the hernia in the inguinal region that I am unable to reduce.  The initial plan is to consult general surgery team, as I was unable to reduce the hernia.   Additional history obtained: Records reviewed  CT abdomen pelvis from yesterday.  It revealed ileus versus SBO.  Hernia noted.  I also reviewed patient's CT scan that reveals evidence of renal cyst, with concerns for tumor.  Independent labs interpretation:  The following labs were independently interpreted: Creatinine today is 2.19.  It was slightly lower yesterday.   Consultation: - Consulted or discussed management/test interpretation with external professional: General surgery saw the patient.  They were able to reduce the hernia.  Patient stable for discharge from their perspective.  Patient received 1 L of IV fluid here.  He tolerated p.o. at home.  P.o. challenge initiated here, which patient also passed.  I shared with him the incidental finding of renal cyst that we will need evaluation by his PCP.  I also indicated to him the rising creatinine and advised PCP follow-up to get his BMP rechecked.  General surgery has already provided appropriate follow-up.    Final Clinical Impression(s) / ED Diagnoses Final diagnoses:   Unilateral inguinal hernia without obstruction or gangrene, recurrence not specified  Ileus  Renal cyst  Elevated creatine kinase    Rx / DC Orders ED Discharge Orders          Ordered    polyethylene glycol (MIRALAX / GLYCOLAX) 17 g packet  Daily        11/25/22 1403              Varney Biles, MD 11/25/22 1459

## 2022-11-28 ENCOUNTER — Encounter (HOSPITAL_COMMUNITY): Payer: Self-pay

## 2022-11-28 ENCOUNTER — Other Ambulatory Visit: Payer: Self-pay

## 2022-11-28 ENCOUNTER — Emergency Department (HOSPITAL_COMMUNITY): Payer: Medicare Other

## 2022-11-28 ENCOUNTER — Emergency Department (HOSPITAL_COMMUNITY)
Admission: EM | Admit: 2022-11-28 | Discharge: 2022-11-28 | Disposition: A | Discharge status: Home / Self Care | Payer: Medicare Other | Attending: Student | Admitting: Student

## 2022-11-28 DIAGNOSIS — K4090 Unilateral inguinal hernia, without obstruction or gangrene, not specified as recurrent: Secondary | ICD-10-CM | POA: Diagnosis present

## 2022-11-28 DIAGNOSIS — I10 Essential (primary) hypertension: Secondary | ICD-10-CM | POA: Insufficient documentation

## 2022-11-28 DIAGNOSIS — Z79899 Other long term (current) drug therapy: Secondary | ICD-10-CM | POA: Diagnosis not present

## 2022-11-28 DIAGNOSIS — K5900 Constipation, unspecified: Secondary | ICD-10-CM

## 2022-11-28 LAB — CBC
HCT: 36 % — ABNORMAL LOW (ref 39.0–52.0)
Hemoglobin: 12.6 g/dL — ABNORMAL LOW (ref 13.0–17.0)
MCH: 32.4 pg (ref 26.0–34.0)
MCHC: 35 g/dL (ref 30.0–36.0)
MCV: 92.5 fL (ref 80.0–100.0)
Platelets: 238 10*3/uL (ref 150–400)
RBC: 3.89 MIL/uL — ABNORMAL LOW (ref 4.22–5.81)
RDW: 12.3 % (ref 11.5–15.5)
WBC: 4.8 10*3/uL (ref 4.0–10.5)
nRBC: 0 % (ref 0.0–0.2)

## 2022-11-28 LAB — COMPREHENSIVE METABOLIC PANEL
ALT: 14 U/L (ref 0–44)
AST: 17 U/L (ref 15–41)
Albumin: 3.9 g/dL (ref 3.5–5.0)
Alkaline Phosphatase: 63 U/L (ref 38–126)
Anion gap: 11 (ref 5–15)
BUN: 19 mg/dL (ref 8–23)
CO2: 23 mmol/L (ref 22–32)
Calcium: 9.2 mg/dL (ref 8.9–10.3)
Chloride: 102 mmol/L (ref 98–111)
Creatinine, Ser: 1.68 mg/dL — ABNORMAL HIGH (ref 0.61–1.24)
GFR, Estimated: 42 mL/min — ABNORMAL LOW (ref >60)
Glucose, Bld: 104 mg/dL — ABNORMAL HIGH (ref 70–99)
Potassium: 4.2 mmol/L (ref 3.5–5.1)
Sodium: 136 mmol/L (ref 135–145)
Total Bilirubin: 1.3 mg/dL — ABNORMAL HIGH (ref 0.3–1.2)
Total Protein: 6.4 g/dL — ABNORMAL LOW (ref 6.5–8.1)

## 2022-11-28 LAB — I-STAT CHEM 8, ED
BUN: 20 mg/dL (ref 8–23)
Calcium, Ion: 1.18 mmol/L (ref 1.15–1.40)
Chloride: 103 mmol/L (ref 98–111)
Creatinine, Ser: 1.8 mg/dL — ABNORMAL HIGH (ref 0.61–1.24)
Glucose, Bld: 103 mg/dL — ABNORMAL HIGH (ref 70–99)
HCT: 37 % — ABNORMAL LOW (ref 39.0–52.0)
Hemoglobin: 12.6 g/dL — ABNORMAL LOW (ref 13.0–17.0)
Potassium: 4.3 mmol/L (ref 3.5–5.1)
Sodium: 137 mmol/L (ref 135–145)
TCO2: 23 mmol/L (ref 22–32)

## 2022-11-28 MED ORDER — IOHEXOL 350 MG/ML SOLN
60.0000 mL | Freq: Once | INTRAVENOUS | Status: AC | PRN
  Administered 2022-11-28: 60 mL via INTRAVENOUS

## 2022-11-28 NOTE — Discharge Instructions (Addendum)
You were seen in the ER today for increase in size of your hernia.  Your lab work was reassuring today, and your CT imaging did not show any emergent findings. The hernia is stable.   There were findings on your right kidney, that could be related to a cyst or possibly malignancy. It is important you follow up with your primary doctor to get additional imaging of this.  For your constipation, you can take stool softeners, miralax, etc. Make sure you're staying well hydrated.   Continue to monitor how you're doing and return to the ER for new or worsening symptoms.

## 2022-11-28 NOTE — ED Notes (Signed)
Pt is a&ox4, warm and dry to touch. Pt complains of pain to hernia site. Denies any other pain or complaints. Pt is in a gown, covered with warm blanket, call light with pt, side rails up x 2. Pt is attached to monitor/vitals.

## 2022-11-28 NOTE — ED Triage Notes (Signed)
Pt arrived POV from home c/o left groin pain. Pt states he has an inguinal hernia but it has gotten bigger over the last couple days.

## 2022-11-28 NOTE — ED Provider Notes (Signed)
[place] EMERGENCY DEPARTMENT AT [facility] Provider Note   CSN: [id number] Arrival date & time: 11/28/22  5956     History  Chief Complaint  Patient presents with   Inguinal Hernia    Marc Vargas is a 77 y.o. male with history of HTN who presents to the ER complaining of increase in size of known inguinal hernia. Patient originally seen on 4/3 with hernia, CT showed evidence of partial SBO but patient left AMA before surgical evaluation. Returned the following day with hernia again and wanting surgical eval. Surgery came to see patient and was able to reduce hernia, discharged in stable condition. Pt presents today complaining of increase in size in left inguinal hernia, pain is overall mild. No nausea or vomiting. Reports constipation, had small BM yesterday. Passing gas. Patient states he has an appointment with the surgical team on 4/22 but since hernia grew in size he wanted to "get it checked out".   HPI     Home Medications Prior to Admission medications   Medication Sig Start Date End Date Taking? Authorizing Provider  amLODipine-atorvastatin (CADUET) 10-40 MG tablet Take 1 tablet by mouth daily.    [provider]  cyclobenzaprine (FLEXERIL) 5 MG tablet Take 1 tablet (5 mg total) by mouth 3 (three) times daily as needed for muscle spasms. 10/31/15   Linwood Dibbles, MD  lidocaine (LIDODERM) 5 % Place 1 patch onto the skin daily. Remove & Discard patch within 12 hours or as directed by MD 11/15/21   Teressa Lower, PA-C  lisinopril (PRINIVIL,ZESTRIL) 10 MG tablet Take 10 mg by mouth daily.    [provider]  polyethylene glycol (MIRALAX / GLYCOLAX) 17 g packet Take 17 g by mouth daily. 11/25/22   Derwood Kaplan, MD  traMADol (ULTRAM) 50 MG tablet Take 1 tablet (50 mg total) by mouth every 6 (six) hours as needed. 09/19/16   Alvira Monday, MD      Allergies    Penicillins and Propofol    Review of Systems   Review of Systems   Gastrointestinal:  Positive for constipation. Negative for abdominal pain, nausea and vomiting.  Genitourinary:        Inguinal swelling  All other systems reviewed and are negative.   Physical Exam Updated Vital Signs BP (!) 143/71   Pulse 67   Temp 98.3 F (36.8 C) (Oral)   Resp 14   Ht 5\' 6"  (1.676 m)   Wt 68 kg   SpO2 100%   BMI 24.21 kg/m  Physical Exam Vitals and nursing note reviewed.  Constitutional:      Appearance: Normal appearance.  HENT:     Head: Normocephalic and atraumatic.  Eyes:     Conjunctiva/sclera: Conjunctivae normal.  Cardiovascular:     Rate and Rhythm: Normal rate and regular rhythm.  Pulmonary:     Effort: Pulmonary effort is normal. No respiratory distress.     Breath sounds: Normal breath sounds.  Abdominal:     General: There is no distension.     Palpations: Abdomen is soft.     Tenderness: There is no abdominal tenderness.       Comments: Large area of swelling in left inguinal region, soft, no overlying skin changes. Hernia reducible, but slowly slides back out. No significant tenderness to palpation.   Skin:    General: Skin is warm and dry.  Neurological:     General: No focal deficit present.     Mental Status: He  is alert.     ED Results / Procedures / Treatments   Labs (all labs ordered are listed, but only abnormal results are displayed) Labs Reviewed  CBC - Abnormal; Notable for the following components:      Result Value   RBC 3.89 (*)    Hemoglobin 12.6 (*)    HCT 36.0 (*)    All other components within normal limits  COMPREHENSIVE METABOLIC PANEL - Abnormal; Notable for the following components:   Glucose, Bld 104 (*)    Creatinine, Ser 1.68 (*)    Total Protein 6.4 (*)    Total Bilirubin 1.3 (*)    GFR, Estimated 42 (*)    All other components within normal limits  I-STAT CHEM 8, ED - Abnormal; Notable for the following components:   Creatinine, Ser 1.80 (*)    Glucose, Bld 103 (*)    Hemoglobin 12.6 (*)     HCT 37.0 (*)    All other components within normal limits    EKG None  Radiology CT ABDOMEN PELVIS W CONTRAST  Result Date: 11/28/2022 CLINICAL DATA:  Concern for bowel obstruction.  Hernia. EXAM: CT ABDOMEN AND PELVIS WITH CONTRAST TECHNIQUE: Multidetector CT imaging of the abdomen and pelvis was performed using the standard protocol following bolus administration of intravenous contrast. RADIATION DOSE REDUCTION: This exam was performed according to the departmental dose-optimization program which includes automated exposure control, adjustment of the mA and/or kV according to patient size and/or use of iterative reconstruction technique. CONTRAST:  66mL OMNIPAQUE IOHEXOL 350 MG/ML SOLN COMPARISON:  11/24/2022 FINDINGS: Lower chest: No acute abnormality.  Coronary artery atherosclerosis. Hepatobiliary: No focal liver abnormality is seen. Small layering stones within the gallbladder. No pericholecystic inflammatory changes by CT. No biliary dilatation. Pancreas: Unremarkable. No pancreatic ductal dilatation or surrounding inflammatory changes. Spleen: Normal in size without focal abnormality. Adrenals/Urinary Tract: Unremarkable adrenal glands. 6.9 cm cyst at the upper pole of the right kidney containing mural nodule (series 3, image 22; series 7, image 31). Multiple additional simple-appearing bilateral renal cysts, stable from recent prior. No renal stone or hydronephrosis. Urinary bladder wall is slightly thickened. Stomach/Bowel: Stomach is within normal limits. Appendix appears normal (series 3, image 51). A portion of the proximal sigmoid colon is again seen extending into patient's left inguinal hernia. No evidence of bowel wall thickening, distention, or inflammatory changes. Moderate volume stool throughout the colon. Vascular/Lymphatic: Aortic atherosclerosis. No enlarged abdominal or pelvic lymph nodes. Reproductive: Prostatomegaly with significant mass effect upon the base of the urinary  bladder. Other: No ascites. No abdominopelvic fluid collection. No pneumoperitoneum. Small fat containing umbilical hernia. Moderate-large left inguinal hernia containing a non compromised-appearing loop of sigmoid colon. Musculoskeletal: No new or acute bony abnormality. 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 IMPRESSION: 1. Moderate-large left inguinal hernia containing a non-compromised loop of sigmoid colon. No evidence of bowel obstruction. 2. Prostatomegaly with significant mass effect upon the base of the urinary bladder. Urinary bladder wall is slightly thickened, likely secondary to chronic outlet obstruction. Correlate with urinalysis to exclude cystitis. 3. Cholelithiasis without evidence of acute cholecystitis. 4. Right upper pole 6.9 cm cyst containing mural nodule. 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 5. Moderate volume of stool throughout the colon. 6. Small fat containing umbilical hernia. 7. Coronary artery atherosclerosis (ICD10-I70.0). Electronically Signed   By: Duanne Guess D.O.   On: 11/28/2022 10:08  Procedures Procedures    Medications Ordered in ED Medications  iohexol (OMNIPAQUE) 350 MG/ML injection 60 mL (60 mLs Intravenous Contrast Given 11/28/22 0949)    ED Course/ Medical Decision Making/ A&P                             Medical Decision Making Amount and/or Complexity of Data Reviewed Labs: ordered. Radiology: ordered.   This patient is a 77 y.o. male  who presents to the ED for concern of left inguinal swelling, known hernia, with constipation.   Differential diagnoses prior to evaluation: The emergent differential diagnosis includes, but is not limited to,  hernia with obstruction and/or strangulation, SBO, LBO. This is not an exhaustive differential.   Past Medical History /  Co-morbidities: HTN  Additional history: Chart reviewed. Pertinent results include: Pt seen on 4/3 for abdominal pain, hernia was reducible, CT scan showed partial SBO vs ileus. Patient was to be [facility] freestanding ER [facility] for surgical evaluation, but patient left AMA prior to transfer. [facility] for ongoing abdominal pain, at that time hernia was not reducible by ER staff. Surgical consultation was obtained and surgical team able to reduce hernia, reviewed patient's CT imaging from day prior, and felt comfortable discharging to home.   Physical Exam: Physical exam performed. The pertinent findings include: Hypertensive, otherwise normal vital signs. Abdomen soft and non-tender. Left sided inguinal swelling consistent with hernia. No skin changes, not significantly tender. Hernia is reducible, but slowly slides back out.   Lab Tests/Imaging studies: I personally interpreted labs/imaging and the pertinent results include:  No leukocytosis, hemoglobin stable compared to prior. Creatinine elevated at 1.68, stable compared to prior.   CT abdomen pelvis with moderate-large left inguinal hernia containing non-compromised loop of sigmoid colon. Prostamegaly, right renal cyst concerning for neoplasm. I agree with the radiologist interpretation.  Disposition: After consideration of the diagnostic results and the patients response to treatment, I feel that emergency department workup does not suggest an emergent condition requiring admission or immediate intervention beyond what has been performed at this time. The plan is: discharge to home with surgery follow up as scheduled later this month. No emergent need for repeat surgical consult today as hernia is reducible and CT imaging without evidence of obstruction. Will recommend outpatient follow up for concerning renal findings on CT imaging, stable from prior scan a few days ago.  The patient is safe for  discharge and has been instructed to return immediately for worsening symptoms, change in symptoms or any other concerns.  Final Clinical Impression(s) / ED Diagnoses Final diagnoses:  Left inguinal hernia  Constipation, unspecified constipation type    Rx / DC Orders ED Discharge Orders     None      Portions of this report may have been transcribed using voice recognition software. Every effort was made to ensure accuracy; however, inadvertent computerized transcription errors may be present.    Jeanella FlatteryRoemhildt, Natlie Asfour T, PA-C 11/28/22 1036    Glendora ScoreKommor, [place], MD 11/28/22 2014

## 2022-12-17 ENCOUNTER — Ambulatory Visit: Payer: Self-pay | Admitting: Surgery

## 2023-01-10 ENCOUNTER — Ambulatory Visit: Admit: 2023-01-10 | Payer: Medicare Other | Admitting: Surgery

## 2023-01-10 SURGERY — REPAIR, HERNIA, INGUINAL, LAPAROSCOPIC
Anesthesia: General

## 2023-07-08 ENCOUNTER — Emergency Department (HOSPITAL_BASED_OUTPATIENT_CLINIC_OR_DEPARTMENT_OTHER): Payer: Medicare Other

## 2023-07-08 ENCOUNTER — Emergency Department (HOSPITAL_BASED_OUTPATIENT_CLINIC_OR_DEPARTMENT_OTHER)
Admission: EM | Admit: 2023-07-08 | Discharge: 2023-07-08 | Disposition: A | Discharge status: Home / Self Care | Payer: Medicare Other | Attending: Emergency Medicine | Admitting: Emergency Medicine

## 2023-07-08 ENCOUNTER — Encounter (HOSPITAL_BASED_OUTPATIENT_CLINIC_OR_DEPARTMENT_OTHER): Payer: Self-pay | Admitting: Emergency Medicine

## 2023-07-08 ENCOUNTER — Other Ambulatory Visit: Payer: Self-pay

## 2023-07-08 DIAGNOSIS — R42 Dizziness and giddiness: Secondary | ICD-10-CM | POA: Insufficient documentation

## 2023-07-08 DIAGNOSIS — Z5329 Procedure and treatment not carried out because of patient's decision for other reasons: Secondary | ICD-10-CM | POA: Diagnosis not present

## 2023-07-08 DIAGNOSIS — E871 Hypo-osmolality and hyponatremia: Secondary | ICD-10-CM | POA: Diagnosis not present

## 2023-07-08 DIAGNOSIS — I129 Hypertensive chronic kidney disease with stage 1 through stage 4 chronic kidney disease, or unspecified chronic kidney disease: Secondary | ICD-10-CM | POA: Insufficient documentation

## 2023-07-08 DIAGNOSIS — N189 Chronic kidney disease, unspecified: Secondary | ICD-10-CM | POA: Insufficient documentation

## 2023-07-08 DIAGNOSIS — Z79899 Other long term (current) drug therapy: Secondary | ICD-10-CM | POA: Diagnosis not present

## 2023-07-08 LAB — CBC
HCT: 33.6 % — ABNORMAL LOW (ref 39.0–52.0)
Hemoglobin: 11.5 g/dL — ABNORMAL LOW (ref 13.0–17.0)
MCH: 30.7 pg (ref 26.0–34.0)
MCHC: 34.2 g/dL (ref 30.0–36.0)
MCV: 89.8 fL (ref 80.0–100.0)
Platelets: 196 10*3/uL (ref 150–400)
RBC: 3.74 MIL/uL — ABNORMAL LOW (ref 4.22–5.81)
RDW: 12.8 % (ref 11.5–15.5)
WBC: 6.5 10*3/uL (ref 4.0–10.5)
nRBC: 0 % (ref 0.0–0.2)

## 2023-07-08 LAB — BASIC METABOLIC PANEL
Anion gap: 9 (ref 5–15)
BUN: 29 mg/dL — ABNORMAL HIGH (ref 8–23)
CO2: 20 mmol/L — ABNORMAL LOW (ref 22–32)
Calcium: 8.9 mg/dL (ref 8.9–10.3)
Chloride: 97 mmol/L — ABNORMAL LOW (ref 98–111)
Creatinine, Ser: 1.59 mg/dL — ABNORMAL HIGH (ref 0.61–1.24)
GFR, Estimated: 44 mL/min — ABNORMAL LOW (ref >60)
Glucose, Bld: 97 mg/dL (ref 70–99)
Potassium: 4.3 mmol/L (ref 3.5–5.1)
Sodium: 126 mmol/L — ABNORMAL LOW (ref 135–145)

## 2023-07-08 LAB — URINALYSIS, ROUTINE W REFLEX MICROSCOPIC
Bilirubin Urine: NEGATIVE
Glucose, UA: NEGATIVE mg/dL
Hgb urine dipstick: NEGATIVE
Ketones, ur: NEGATIVE mg/dL
Leukocytes,Ua: NEGATIVE
Nitrite: NEGATIVE
Protein, ur: NEGATIVE mg/dL
Specific Gravity, Urine: <=1.005 (ref 1.005–1.030)
pH: 5.5 (ref 5.0–8.0)

## 2023-07-08 LAB — TROPONIN I (HIGH SENSITIVITY)
Troponin I (High Sensitivity): 5 ng/L (ref <18)
Troponin I (High Sensitivity): 5 ng/L (ref <18)

## 2023-07-08 LAB — HEPATIC FUNCTION PANEL
ALT: 33 U/L (ref 0–44)
AST: 38 U/L (ref 15–41)
Albumin: 4.5 g/dL (ref 3.5–5.0)
Alkaline Phosphatase: 67 U/L (ref 38–126)
Bilirubin, Direct: 0.2 mg/dL (ref 0.0–0.2)
Indirect Bilirubin: 1.7 mg/dL — ABNORMAL HIGH (ref 0.3–0.9)
Total Bilirubin: 1.9 mg/dL — ABNORMAL HIGH (ref <1.2)
Total Protein: 7.5 g/dL (ref 6.5–8.1)

## 2023-07-08 LAB — CBG MONITORING, ED: Glucose-Capillary: 79 mg/dL (ref 70–99)

## 2023-07-08 MED ORDER — MECLIZINE HCL 25 MG PO TABS
25.0000 mg | ORAL_TABLET | Freq: Once | ORAL | Status: AC
  Administered 2023-07-08: 25 mg via ORAL
  Filled 2023-07-08: qty 1

## 2023-07-08 MED ORDER — SODIUM CHLORIDE 0.9 % IV BOLUS
500.0000 mL | Freq: Once | INTRAVENOUS | Status: AC
  Administered 2023-07-08: 500 mL via INTRAVENOUS

## 2023-07-08 MED ORDER — IOHEXOL 350 MG/ML SOLN: 60.0000 mL | Freq: Once | INTRAVENOUS | Status: DC | PRN

## 2023-07-08 NOTE — ED Provider Notes (Addendum)
77 yo male presenting with dizziness for 5 days intermittent, positive romberg on exam, ataxic gait  Labs notable for Na hyponatremia trending down, now to 126  Patient had refused CT angio given CKD iodine concerns  Planning for medical admission for hyponatremia and anticipating MRA and MRI brain upon admission for posterior circulation and cerebellar evaluation - patient is outside window of intervention for thrombolytic therapy, and does not demonstrate symptoms of LVO   Physical Exam  BP 133/63 (BP Location: Right Arm)   Pulse 93   Temp 98.5 F (36.9 C) (Oral)   Resp (!) 93   Ht 5\' 6"  (1.676 m)   Wt 68 kg   SpO2 100%   BMI 24.20 kg/m   Physical Exam  Procedures  Procedures  ED Course / MDM    Medical Decision Making Amount and/or Complexity of Data Reviewed Labs: ordered. Radiology: ordered. ECG/medicine tests: ordered.  Risk Prescription drug management. Decision regarding hospitalization.   815 AM - admitted to hospitalist  915 am -I was called back into the room as the patient is adamant that he is wanting to leave the hospital, and is refusing admission.  The patient reports that he never had dizziness complaints, this was not a concern last night.  He says he simply came in because his blood pressure was high.  He does not want to be admitted to the hospital.  I explained him my concerns for his low sodium level, with unclear etiology, and the risk that this can pose.  This can lead to his symptoms of seizure, coma, and death.  The patient says he has a PCP appointment today for further work this up as an outpatient.  I still encouraged him to stay in the hospital for more expedited workup but he does not want to do so.  He also repeatedly denies dizziness.  I pointed out to his earlier documentation by overnight provider as well as at outside hospital 5 days ago, in both cases recommended for MRI of the brain, but he says this is not a concern of his, and he  does not want the MRI.  He understands that we may be missing serious pathology including stroke, vascular insufficiency or other causes of dizziness.  At this time however he is ambulating steadily in the ED, has stable vital signs, and appears to have capacity and understanding of the risks to make the decision to leave AGAINST MEDICAL ADVICE.  He will be discharged     Terald Sleeper, MD 07/08/23 2130    Terald Sleeper, MD 07/08/23 [phone]

## 2023-07-08 NOTE — Discharge Instructions (Signed)
We have strongly recommended admitting you to the hospital for your low sodium level as well as your reported spells of dizziness.  We explained that low sodium or salt levels can eventually lead to seizures, coma, and other serious medical conditions.  It is not clear what is causing your levels to be low.  You will need to follow-up with your doctor for this issue.  We also recommend admission to the hospital for MRI imaging of your brain to look for evidence of potential stroke or problems with the blood flow in the back of your brain.  This can cause dizziness.  You told me repeatedly you are not having dizziness and this was not a concern of years.  You understand by leaving the hospital we may be missing a serious medical condition, including stroke, blood flow problems to the brain, or other life-threatening condition.  If you do have dizziness, balance problems, or strokelike symptoms, please call 911 or return immediately to the hospital.

## 2023-07-08 NOTE — ED Provider Notes (Signed)
[place] [facility] Provider Note   CSN: [id number] Arrival date & time: 07/08/23  0456     History  Chief Complaint  Patient presents with   Hypertension    Marc Vargas is a 77 y.o. male.  Patient with a history of hypertension and CKD here with elevated blood pressure and dizzy spells.  States he has not felt quite right for several hours this evening.  He is a poor historian.  States he started feeling off balance some room spinning dizziness that comes and goes since last evening.  He started checking his blood pressure and noticed it was as high as 174 systolic.  It is fluctuated from the 150s to the 170s.  He does have a history of hypertension and takes lisinopril as well as amlodipine.  No recent medication changes but reports his lisinopril was increased from 20 mg to 40 mg several weeks ago. No significant headache.  No visual changes.  No focal weakness, numbness or tingling.  No difficulty speaking or difficulty swallowing.  No chest pain or shortness of breath.  No visual changes.  Notably he was at [facility] hospital November 10 with similar symptoms including dizziness.  He was recommended to have an MRI to rule out stroke but left AMA.  He is uncertain if this feels the same or different but reports he has had several these episodes tonight and feels "not like myself" with intermittent dizziness.  The history is provided by the patient.  Hypertension Associated symptoms include headaches. Pertinent negatives include no abdominal pain and no shortness of breath.       Home Medications Prior to Admission medications   Medication Sig Start Date End Date Taking? Authorizing Provider  amLODipine-atorvastatin (CADUET) 10-40 MG tablet Take 1 tablet by mouth daily.    [provider]  cyclobenzaprine (FLEXERIL) 5 MG tablet Take 1 tablet (5 mg total) by mouth 3 (three) times daily as needed for muscle spasms.  10/31/15   Linwood Dibbles, MD  lidocaine (LIDODERM) 5 % Place 1 patch onto the skin daily. Remove & Discard patch within 12 hours or as directed by MD 11/15/21   Teressa Lower, PA-C  lisinopril (PRINIVIL,ZESTRIL) 10 MG tablet Take 10 mg by mouth daily.    [provider]  polyethylene glycol (MIRALAX / GLYCOLAX) 17 g packet Take 17 g by mouth daily. 11/25/22   Derwood Kaplan, MD  traMADol (ULTRAM) 50 MG tablet Take 1 tablet (50 mg total) by mouth every 6 (six) hours as needed. 09/19/16   Alvira Monday, MD      Allergies    Penicillins and Propofol    Review of Systems   Review of Systems  Constitutional:  Negative for activity change, appetite change and fever.  HENT:  Negative for congestion and rhinorrhea.   Respiratory:  Negative for cough, chest tightness and shortness of breath.   Gastrointestinal:  Negative for abdominal pain, nausea and vomiting.  Genitourinary:  Negative for dysuria and hematuria.  Musculoskeletal:  Negative for arthralgias and myalgias.  Skin:  Negative for wound.  Neurological:  Positive for dizziness, weakness, light-headedness and headaches.   all other systems are negative except as noted in the HPI and PMH.    Physical Exam Updated Vital Signs BP (!) 148/67   Pulse 79   Resp 15   Ht 5\' 6"  (1.676 m)   Wt 68 kg   SpO2 100%   BMI 24.20 kg/m  Physical Exam Vitals and nursing note reviewed.  Constitutional:      General: He is not in acute distress.    Appearance: He is well-developed. He is not ill-appearing.  HENT:     Head: Normocephalic and atraumatic.     Mouth/Throat:     Pharynx: No oropharyngeal exudate.  Eyes:     Conjunctiva/sclera: Conjunctivae normal.     Pupils: Pupils are equal, round, and reactive to light.  Neck:     Comments: No meningismus. Cardiovascular:     Rate and Rhythm: Normal rate and regular rhythm.     Heart sounds: Normal heart sounds. No murmur heard. Pulmonary:     Effort: Pulmonary effort is normal.  No respiratory distress.     Breath sounds: Normal breath sounds.  Chest:     Chest wall: No tenderness.  Abdominal:     Palpations: Abdomen is soft.     Tenderness: There is no abdominal tenderness. There is no guarding or rebound.  Musculoskeletal:        General: No tenderness. Normal range of motion.     Cervical back: Normal range of motion and neck supple.  Skin:    General: Skin is warm.     Capillary Refill: Capillary refill takes less than 2 seconds.  Neurological:     General: No focal deficit present.     Mental Status: He is alert and oriented to person, place, and time. Mental status is at baseline.     Cranial Nerves: No cranial nerve deficit.     Motor: No abnormal muscle tone.     Coordination: Coordination normal.     Comments: CN 2-12 intact, no ataxia on finger to nose, no nystagmus, 5/5 strength throughout, no pronator drift,  Positive Romberg, ataxic gait, no ataxia on finger-to-nose.  No nystagmus.  Test of skew negative, head impulse testing negative.    Psychiatric:        Behavior: Behavior normal.     ED Results / Procedures / Treatments   Labs (all labs ordered are listed, but only abnormal results are displayed) Labs Reviewed  BASIC METABOLIC PANEL - Abnormal; Notable for the following components:      Result Value   Sodium 126 (*)    Chloride 97 (*)    CO2 20 (*)    BUN 29 (*)    Creatinine, Ser 1.59 (*)    GFR, Estimated 44 (*)    All other components within normal limits  CBC - Abnormal; Notable for the following components:   RBC 3.74 (*)    Hemoglobin 11.5 (*)    HCT 33.6 (*)    All other components within normal limits  HEPATIC FUNCTION PANEL - Abnormal; Notable for the following components:   Total Bilirubin 1.9 (*)    Indirect Bilirubin 1.7 (*)    All other components within normal limits  URINALYSIS, ROUTINE W REFLEX MICROSCOPIC  CBG MONITORING, ED  TROPONIN I (HIGH SENSITIVITY)  TROPONIN I (HIGH SENSITIVITY)    EKG EKG  Interpretation Date/Time:  Friday July 08 2023 05:09:34 EST Ventricular Rate:  79 PR Interval:  161 QRS Duration:  85 QT Interval:  370 QTC Calculation: 425 R Axis:   9  Text Interpretation: Sinus rhythm No significant change was found Confirmed by Glynn Octave [phone]) on 07/08/2023 5:14:08 AM  Radiology No results found.  Procedures Procedures    Medications Ordered in ED Medications - No data to display  ED Course/ Medical Decision Making/ A&P  Medical Decision Making Amount and/or Complexity of Data Reviewed External Data Reviewed: notes.    Details: [facility] ED visit November 10 Labs: ordered. Decision-making details documented in ED Course. Radiology: ordered and independent interpretation performed. Decision-making details documented in ED Course. ECG/medicine tests: ordered and independent interpretation performed. Decision-making details documented in ED Course.  Risk Prescription drug management.   Episode of dizziness and elevated blood pressure.  Neurological exam is nonfocal with possible positive Romberg.  No ataxia on finger-to-nose, no nystagmus.  Recent ER visit November 10 with concern for possible CVA and MRI recommended but he left AMA.  His exam today is nonfocal although he does have positive Romberg.  Denies chest pain.  CT head from November 10 reviewed and was reassuring.  Code stroke not activated due to delay in presentation and not TNK candidate.  Will check orthostatics.  Sodium is low at 126.  His baseline appears to be in the low 130s.  He is not in any diuretics. Hyponatremia may be contributing to his dizziness.  [facility] did have concern for central cause of dizziness and recommended MRI but patient left AMA.  He is willing to be transferred for MRI today though his dizziness does not appear to be central in origin. Will also obtain MRA given concern for vertebrobasilar insufficiency  and his elevated creatinine.  Creatinine is actually acceptable for IV contrast for CTA but patient refuses due to his kidney function.  Plan to be transferred at shift change to [facility] for MRI.  D/w Dr. Bebe Shaggy in the ED. Given his low sodium we will plan for hospitalist admission.  Call pending at shift change. Dr. Renaye Rakers to assume care.        Final Clinical Impression(s) / ED Diagnoses Final diagnoses:  Hyponatremia  Dizziness    Rx / DC Orders ED Discharge Orders     None         Amanat Hackel, Jeannett Senior, MD 07/08/23 [phone]

## 2023-07-08 NOTE — ED Triage Notes (Signed)
Pt state was taking blood pressure at home and got so reading in the 170's. States slight dizziness with it.

## 2023-07-08 NOTE — ED Notes (Signed)
Pt states he wants to go home and is not going to stay he has a dr appointment to go to states had called them to tell them he was going to be late

## 2023-07-08 NOTE — Hospital Course (Signed)
HTN, HLD, CKD stage IIIB, DDD, GERD  Initially presented to [facility] ED on 11/10 with complaints of dizziness or weakness. He was found to have hyponatremia to 130, hypokalemia 4.9 and mild anemia with Hgb of 12.1. CT head and CXR did not show any acute findings. Patient refused CTA head and neck due to his kidney disease. They plan to admit patient to get MRI brains and carotic Dopplers however patient refused and left AMA.  He presented to [facility] today with ongoing dizziness elevated BP. ***   ED course:  Mildly hypertensive with SBP in the 130s-150s, otherwise normal vitals. Negative orthostatic vitals Labs show sodium 126, K+ 4.3, chloride 97, bicarb 20, BUN/creatinine 29/1.59, bilirubin 1.9, WBC 6.5, Hgb 11.5, platelet 196, troponin 5-5, negative UA.   Patient refused CTA head and neck due to kidney disease but agreed to admission at [facility] for MRI brain/MRA head and neck. Admitted to [facility] service and transferred to [facility]   # Hyponatremia Elderly patient presented with dizziness found to have sodium of 126, down from 130 5 days ago.  Patient not on any diuretic oral SSRIs.  S/p 500 cc of IV NS in the ED. -Repeat BMP -Check serum osmole, TSH and cortisol -Check urine osmolality and sodium -   # Dizziness Patient presented with 5 days of intermittent dizzy spells.  # HTN Home meds includes amlodipine 10 mg and lisinopril 40 mg  # CKD 3B Baseline creatinine around 1.6-1.7.  Creatinine of 1.59 around baseline. -Avoid nephrotoxic agents  # HLD -Atorvastatin 40 mg daily

## 2023-09-19 ENCOUNTER — Encounter (HOSPITAL_BASED_OUTPATIENT_CLINIC_OR_DEPARTMENT_OTHER): Payer: Self-pay | Admitting: Surgery

## 2023-09-23 ENCOUNTER — Encounter (HOSPITAL_BASED_OUTPATIENT_CLINIC_OR_DEPARTMENT_OTHER)
Admission: RE | Disposition: A | Discharge status: Home / Self Care | Payer: Self-pay | Source: Home / Self Care | Attending: Surgery

## 2023-09-23 ENCOUNTER — Ambulatory Visit (HOSPITAL_BASED_OUTPATIENT_CLINIC_OR_DEPARTMENT_OTHER): Payer: Medicare Other | Admitting: Certified Registered Nurse Anesthetist

## 2023-09-23 ENCOUNTER — Other Ambulatory Visit: Payer: Self-pay

## 2023-09-23 ENCOUNTER — Ambulatory Visit (HOSPITAL_BASED_OUTPATIENT_CLINIC_OR_DEPARTMENT_OTHER)
Admission: RE | Admit: 2023-09-23 | Discharge: 2023-09-23 | Disposition: A | Discharge status: Home / Self Care | Payer: Medicare Other | Attending: Surgery | Admitting: Surgery

## 2023-09-23 ENCOUNTER — Encounter (HOSPITAL_BASED_OUTPATIENT_CLINIC_OR_DEPARTMENT_OTHER): Payer: Self-pay | Admitting: Surgery

## 2023-09-23 DIAGNOSIS — I10 Essential (primary) hypertension: Secondary | ICD-10-CM | POA: Diagnosis not present

## 2023-09-23 DIAGNOSIS — Z01818 Encounter for other preprocedural examination: Secondary | ICD-10-CM

## 2023-09-23 DIAGNOSIS — K4090 Unilateral inguinal hernia, without obstruction or gangrene, not specified as recurrent: Secondary | ICD-10-CM

## 2023-09-23 DIAGNOSIS — Z79899 Other long term (current) drug therapy: Secondary | ICD-10-CM | POA: Diagnosis not present

## 2023-09-23 HISTORY — PX: INGUINAL HERNIA REPAIR: SHX194

## 2023-09-23 SURGERY — REPAIR, HERNIA, INGUINAL, LAPAROSCOPIC
Anesthesia: General | Site: Abdomen | Laterality: Right

## 2023-09-23 MED ORDER — OXYCODONE HCL 5 MG PO TABS
5.0000 mg | ORAL_TABLET | Freq: Once | ORAL | Status: AC | PRN
  Administered 2023-09-23: 5 mg via ORAL

## 2023-09-23 MED ORDER — SUGAMMADEX SODIUM 200 MG/2ML IV SOLN
INTRAVENOUS | Status: DC | PRN
  Administered 2023-09-23: 200 mg via INTRAVENOUS

## 2023-09-23 MED ORDER — ONDANSETRON HCL 4 MG/2ML IJ SOLN
INTRAMUSCULAR | Status: DC | PRN
  Administered 2023-09-23: 4 mg via INTRAVENOUS

## 2023-09-23 MED ORDER — BUPIVACAINE LIPOSOME 1.3 % IJ SUSP: 20.0000 mL | Freq: Once | INTRAMUSCULAR | Status: DC

## 2023-09-23 MED ORDER — OXYCODONE HCL 5 MG PO TABS
ORAL_TABLET | ORAL | Status: AC
  Filled 2023-09-23: qty 1

## 2023-09-23 MED ORDER — ACETAMINOPHEN 500 MG PO TABS
ORAL_TABLET | ORAL | Status: AC
  Filled 2023-09-23: qty 2

## 2023-09-23 MED ORDER — CHLORHEXIDINE GLUCONATE CLOTH 2 % EX PADS: 6.0000 | MEDICATED_PAD | Freq: Once | CUTANEOUS | Status: DC

## 2023-09-23 MED ORDER — ACETAMINOPHEN 500 MG PO TABS
1000.0000 mg | ORAL_TABLET | ORAL | Status: AC
  Administered 2023-09-23: 1000 mg via ORAL

## 2023-09-23 MED ORDER — CEFAZOLIN SODIUM-DEXTROSE 2-4 GM/100ML-% IV SOLN
INTRAVENOUS | Status: AC
  Filled 2023-09-23: qty 100

## 2023-09-23 MED ORDER — LIDOCAINE 2% (20 MG/ML) 5 ML SYRINGE
INTRAMUSCULAR | Status: AC
  Filled 2023-09-23: qty 5

## 2023-09-23 MED ORDER — CEFAZOLIN SODIUM-DEXTROSE 2-4 GM/100ML-% IV SOLN
2.0000 g | INTRAVENOUS | Status: AC
Start: 2023-09-23 — End: 2023-09-23
  Administered 2023-09-23: 2 g via INTRAVENOUS

## 2023-09-23 MED ORDER — PHENYLEPHRINE HCL (PRESSORS) 10 MG/ML IV SOLN
INTRAVENOUS | Status: DC | PRN
  Administered 2023-09-23: 80 ug via INTRAVENOUS

## 2023-09-23 MED ORDER — ROCURONIUM BROMIDE 10 MG/ML (PF) SYRINGE
PREFILLED_SYRINGE | INTRAVENOUS | Status: AC
  Filled 2023-09-23: qty 10

## 2023-09-23 MED ORDER — KETOROLAC TROMETHAMINE 15 MG/ML IJ SOLN
15.0000 mg | Freq: Once | INTRAMUSCULAR | Status: AC
  Administered 2023-09-23: 15 mg via INTRAVENOUS

## 2023-09-23 MED ORDER — PHENYLEPHRINE 80 MCG/ML (10ML) SYRINGE FOR IV PUSH (FOR BLOOD PRESSURE SUPPORT)
PREFILLED_SYRINGE | INTRAVENOUS | Status: AC
  Filled 2023-09-23: qty 10

## 2023-09-23 MED ORDER — LACTATED RINGERS IV SOLN: INTRAVENOUS | Status: DC

## 2023-09-23 MED ORDER — PROPOFOL 10 MG/ML IV BOLUS
INTRAVENOUS | Status: AC
  Filled 2023-09-23: qty 20

## 2023-09-23 MED ORDER — KETOROLAC TROMETHAMINE 15 MG/ML IJ SOLN
INTRAMUSCULAR | Status: AC
Start: 2023-09-23 — End: ?
  Filled 2023-09-23: qty 1

## 2023-09-23 MED ORDER — ROCURONIUM BROMIDE 100 MG/10ML IV SOLN
INTRAVENOUS | Status: DC | PRN
  Administered 2023-09-23: 50 mg via INTRAVENOUS

## 2023-09-23 MED ORDER — ONDANSETRON HCL 4 MG/2ML IJ SOLN
INTRAMUSCULAR | Status: AC
  Filled 2023-09-23: qty 2

## 2023-09-23 MED ORDER — PROPOFOL 10 MG/ML IV BOLUS
INTRAVENOUS | Status: DC | PRN
  Administered 2023-09-23: 140 mg via INTRAVENOUS

## 2023-09-23 MED ORDER — OXYCODONE HCL 5 MG/5ML PO SOLN: 5.0000 mg | Freq: Once | ORAL | Status: AC | PRN

## 2023-09-23 MED ORDER — DEXAMETHASONE SODIUM PHOSPHATE 4 MG/ML IJ SOLN
INTRAMUSCULAR | Status: DC | PRN
  Administered 2023-09-23: 10 mg via INTRAVENOUS

## 2023-09-23 MED ORDER — GABAPENTIN 300 MG PO CAPS
ORAL_CAPSULE | ORAL | Status: AC
  Filled 2023-09-23: qty 1

## 2023-09-23 MED ORDER — BUPIVACAINE LIPOSOME 1.3 % IJ SUSP
INTRAMUSCULAR | Status: DC | PRN
  Administered 2023-09-23: 50 mL

## 2023-09-23 MED ORDER — FENTANYL CITRATE (PF) 100 MCG/2ML IJ SOLN
INTRAMUSCULAR | Status: AC
  Filled 2023-09-23: qty 2

## 2023-09-23 MED ORDER — GABAPENTIN 300 MG PO CAPS: 300.0000 mg | ORAL_CAPSULE | ORAL | Status: DC

## 2023-09-23 MED ORDER — FENTANYL CITRATE (PF) 100 MCG/2ML IJ SOLN
INTRAMUSCULAR | Status: DC | PRN
  Administered 2023-09-23 (×2): 50 ug via INTRAVENOUS

## 2023-09-23 MED ORDER — FENTANYL CITRATE (PF) 100 MCG/2ML IJ SOLN: 25.0000 ug | INTRAMUSCULAR | Status: DC | PRN

## 2023-09-23 MED ORDER — LIDOCAINE HCL (CARDIAC) PF 100 MG/5ML IV SOSY
PREFILLED_SYRINGE | INTRAVENOUS | Status: DC | PRN
  Administered 2023-09-23: 60 mg via INTRAVENOUS

## 2023-09-23 SURGICAL SUPPLY — 34 items
BLADE CLIPPER SENSICLIP SURGIC (BLADE) IMPLANT
CHLORAPREP W/TINT 26 (MISCELLANEOUS) ×1 IMPLANT
DERMABOND ADVANCED .7 DNX12 (GAUZE/BANDAGES/DRESSINGS) ×1 IMPLANT
ELECT REM PT RETURN 9FT ADLT (ELECTROSURGICAL) ×1
ELECTRODE REM PT RTRN 9FT ADLT (ELECTROSURGICAL) ×1 IMPLANT
GAUZE 4X4 16PLY ~~LOC~~+RFID DBL (SPONGE) IMPLANT
GLOVE BIO SURGEON STRL SZ7.5 (GLOVE) ×1 IMPLANT
GLOVE BIOGEL PI IND STRL 8 (GLOVE) ×1 IMPLANT
GOWN STRL REUS W/ TWL XL LVL3 (GOWN DISPOSABLE) ×1 IMPLANT
GRASPER SUT TROCAR 14GX15 (MISCELLANEOUS) ×1 IMPLANT
IRRIG SUCT STRYKERFLOW 2 WTIP (MISCELLANEOUS)
IRRIGATION SUCT STRKRFLW 2 WTP (MISCELLANEOUS) IMPLANT
MESH 3DMAX 4X6 RT LRG (Mesh General) IMPLANT
NDL INSUFFLATION 14GA 120MM (NEEDLE) ×1 IMPLANT
NEEDLE INSUFFLATION 14GA 120MM (NEEDLE) ×1
PACK BASIN DAY SURGERY FS (CUSTOM PROCEDURE TRAY) ×1 IMPLANT
RELOAD STAPLE 4.0 BLU F/HERNIA (INSTRUMENTS) IMPLANT
RELOAD STAPLE 4.8 BLK F/HERNIA (STAPLE) IMPLANT
RELOAD STAPLE HERNIA 4.0 BLUE (INSTRUMENTS) ×1
RELOAD STAPLE HERNIA 4.8 BLK (STAPLE)
SCISSORS LAP 5X35 DISP (ENDOMECHANICALS) ×1 IMPLANT
SET TUBE SMOKE EVAC HIGH FLOW (TUBING) ×1 IMPLANT
SLEEVE SCD COMPRESS KNEE MED (STOCKING) ×1 IMPLANT
SPIKE FLUID TRANSFER (MISCELLANEOUS) IMPLANT
SPONGE T-LAP 18X18 ~~LOC~~+RFID (SPONGE) IMPLANT
STAPLER HERNIA 12 8.5 360D (INSTRUMENTS) IMPLANT
SUT MNCRL AB 4-0 PS2 18 (SUTURE) ×1 IMPLANT
SUT VICRYL 0 UR6 27IN ABS (SUTURE) IMPLANT
SUT VLOC 180 2-0 9IN GS21 (SUTURE) IMPLANT
TOWEL GREEN STERILE FF (TOWEL DISPOSABLE) ×1 IMPLANT
TRAY FOL W/BAG SLVR 16FR STRL (SET/KITS/TRAYS/PACK) IMPLANT
TRAY LAPAROSCOPIC (CUSTOM PROCEDURE TRAY) ×1 IMPLANT
TROCAR Z-THREAD FIOS 12X100MM (TROCAR) ×1 IMPLANT
TROCAR Z-THREAD FIOS 5X100MM (TROCAR) ×2 IMPLANT

## 2023-09-23 NOTE — Anesthesia Procedure Notes (Addendum)
Procedure Name: Intubation Date/Time: 09/23/2023 11:27 AM  Performed by: Cleda Clarks, CRNAPre-anesthesia Checklist: Patient identified, Emergency Drugs available, Suction available and Patient being monitored Patient Re-evaluated:Patient Re-evaluated prior to induction Oxygen Delivery Method: Circle system utilized Preoxygenation: Pre-oxygenation with 100% oxygen Induction Type: IV induction Ventilation: Mask ventilation without difficulty Laryngoscope Size: Mac and 4 Grade View: Grade I Tube type: Oral Tube size: 7.0 mm Number of attempts: 1 Airway Equipment and Method: Stylet and Oral airway Placement Confirmation: ETT inserted through vocal cords under direct vision, positive ETCO2 and breath sounds checked- equal and bilateral Secured at: 22 cm Tube secured with: Tape Dental Injury: Teeth and Oropharynx as per pre-operative assessment

## 2023-09-23 NOTE — Anesthesia Postprocedure Evaluation (Signed)
Anesthesia Post Note  Patient: Marc Vargas  Procedure(s) Performed: LAPAROSCOPIC RIGHT INGUINAL HERNIA REPAIR WITH MESH (Right: Abdomen)     Patient location during evaluation: PACU Anesthesia Type: General Level of consciousness: awake and alert Pain management: pain level controlled Vital Signs Assessment: post-procedure vital signs reviewed and stable Respiratory status: spontaneous breathing, nonlabored ventilation, respiratory function stable and patient connected to nasal cannula oxygen Cardiovascular status: blood pressure returned to baseline and stable Postop Assessment: no apparent nausea or vomiting Anesthetic complications: no   No notable events documented.  Last Vitals:  Vitals:   09/23/23 1345 09/23/23 1415  BP: (!) 158/72   Pulse: 78 82  Resp: 15 16  Temp: (!) 36.4 C   SpO2: 96% 98%    Last Pain:  Vitals:   09/23/23 1415  TempSrc:   PainSc: 4                  Dezmen Alcock P Jovi Zavadil

## 2023-09-23 NOTE — Discharge Instructions (Addendum)
GROIN HERNIA REPAIR POST OPERATIVE INSTRUCTIONS  Thinking Clearly  The anesthesia may cause you to feel different for 1 or 2 days. Do not drive, drink alcohol, or make any big decisions for at least 2 days.  Nutrition When you wake up, you will be able to drink small amounts of liquid. If you do not feel sick, you can slowly advance your diet to regular foods. Continue to drink lots of fluids, usually about 8 to 10 glasses per day. Eat a high-fiber diet so you don't strain during bowel movements. High-Fiber Foods Foods high in fiber include beans, bran cereals and whole-grain breads, peas, dried fruit (figs, apricots, and dates), raspberries, blackberries, strawberries, sweet corn, broccoli, baked potatoes with skin, plums, pears, apples, greens, and nuts. Activity Slowly increase your activity. Be sure to get up and walk every hour or so to prevent blood clots. No heavy lifting or strenuous activity for 4 weeks following surgery to prevent hernias at your incision sites or recurrence of your hernia. It is normal to feel tired. You may need more sleep than usual.  Get your rest but make sure to get up and move around frequently to prevent blood clots and pneumonia.  Work and Return to [employer] can go back to work when you feel well enough. Discuss the timing with your surgeon. You can usually go back to school or work 1 week or less after an laparoscopic or an open repair. If your work requires heavy lifting or strenuous activity you need to be placed on light duty for 4 weeks following surgery. You can return to gym class, sports or other physical activities 4 weeks after surgery.  Wound Care You may experience significant bruising in the groin including into the scrotum in males.  Rest, elevating the groin and scrotum above the level of the heart, ice and compression with tight fitting underwear can help.  Always wash your hands before and after touching near your incision site. Do  not soak in a bathtub until cleared at your follow up appointment. You may take a shower 24 hours after surgery. A small amount of drainage from the incision is normal. If the drainage is thick and yellow or the site is red, you may have an infection, so call your surgeon. If you have a drain in one of your incisions, it will be taken out in office when the drainage stops. Steri-Strips will fall off in 7 to 10 days or they will be removed during your first office visit. If you have dermabond glue covering over the incision, allow the glue to flake off on its own. Protect the new skin, especially from the sun. The sun can burn and cause darker scarring. Your scar will heal in about 4 to 6 weeks and will become softer and continue to fade over the next year.  The cosmetic appearance of the incisions will improve over the course of the first year after surgery. Sensation around your incision will return in a few weeks or months.  Bowel Movements After intestinal surgery, you may have loose watery stools for several days. If watery diarrhea lasts longer than 3 days, contact your surgeon. Pain medication (narcotics) can cause constipation. Increase the fiber in your diet with high-fiber foods if you are constipated. You can take an over the counter stool softener like Colace to avoid constipation.  Additional over the counter medications can also be used if Colace isn't sufficient (for example, Milk of Magnesia or Miralax).  Pain The amount of pain is different for each person. Some people need only 1 to 3 doses of pain control medication, while others need more. Take alternating doses of tylenol and ibuprofen around the clock for the first five days following surgery.  This will provide a baseline of pain control and help with inflammation.  Take the narcotic pain medication in addition if needed for severe pain.  Contact Your Surgeon at [phone], if you have: Pain that will not go away Pain that  gets worse A fever of more than 101F (38.3C) Repeated vomiting Swelling, redness, bleeding, or bad-smelling drainage from your wound site Strong abdominal pain No bowel movement or unable to pass gas for 3 days Watery diarrhea lasting longer than 3 days  Pain Control The goal of pain control is to minimize pain, keep you moving and help you heal. Your surgical team will work with you on your pain plan. Most often a combination of therapies and medications are used to control your pain. You may also be given medication (local anesthetic) at the surgical site. This may help control your pain for several days. Extreme pain puts extra stress on your body at a time when your body needs to focus on healing. Do not wait until your pain has reached a level "10" or is unbearable before telling your doctor or nurse. It is much easier to control pain before it becomes severe. Following a laparoscopic procedure, pain is sometimes felt in the shoulder. This is due to the gas inserted into your abdomen during the procedure. Moving and walking helps to decrease the gas and the right shoulder pain.  Use the guide below for ways to manage your post-operative pain. Learn more by going to facs.org/safepaincontrol.  How Intense Is My Pain Common Therapies to Feel Better       I hardly notice my pain, and it does not interfere with my activities.  I notice my pain and it distracts me, but I can still do activities (sitting up, walking, standing).  Non-Medication Therapies  Ice (in a bag, applied over clothing at the surgical site), elevation, rest, meditation, massage, distraction (music, TV, play) walking and mild exercise Splinting the abdomen with pillows +  Non-Opioid Medications Acetaminophen (Tylenol) Non-steroidal anti-inflammatory drugs (NSAIDS) Aspirin, Ibuprofen (Motrin, Advil) Naproxen (Aleve) Take these as needed, when you feel pain. Both acetaminophen and NSAIDs help to decrease pain  and swelling (inflammation).      My pain is hard to ignore and is more noticeable even when I rest.  My pain interferes with my usual activities.  Non-Medication Therapies  +  Non-Opioid medications  Take on a regular schedule (around-the-clock) instead of as needed. (For example, Tylenol every 6 hours at 9:00 am, 3:00 pm, 9:00 pm, 3:00 am and Motrin every 6 hours at 12:00 am, 6:00 am, 12:00 pm, 6:00 pm)         I am focused on my pain, and I am not doing my daily activities.  I am groaning in pain, and I cannot sleep. I am unable to do anything.  My pain is as bad as it could be, and nothing else matters.  Non-Medication Therapies  +  Around-the-Clock Non-Opioid Medications  +  Short-acting opioids  Opioids should be used with other medications to manage severe pain. Opioids block pain and give a feeling of euphoria (feel high). Addiction, a serious side effect of opioids, is rare with short-term (a few days) use.  Examples of short-acting opioids  include: Tramadol (Ultram), Hydrocodone (Norco, Vicodin), Hydromorphone (Dilaudid), Oxycodone (Oxycontin)     The above directions have been adapted from the [employer] of [employer] Surgical Patient Education Program.  Please refer to the ACS website if needed: http://chapman.info/.ashx   Ivar Drape, MD [facility] [address], [address], [place], [place]  [address] ?  [address] [address], [place], [place]   [address] [phone] ? [phone] ? FAX [phone] Web site: [email or web address] No Tylenol until 4:24

## 2023-09-23 NOTE — Transfer of Care (Signed)
Immediate Anesthesia Transfer of Care Note  Patient: Marc Vargas  Procedure(s) Performed: LAPAROSCOPIC RIGHT INGUINAL HERNIA REPAIR WITH MESH (Right: Abdomen)  Patient Location: PACU  Anesthesia Type:General  Level of Consciousness: awake, alert , and oriented  Airway & Oxygen Therapy: Patient Spontanous Breathing and Patient connected to face mask oxygen  Post-op Assessment: Report given to RN and Post -op Vital signs reviewed and stable  Post vital signs: Reviewed and stable  Last Vitals:  Vitals Value Taken Time  BP 162/87 09/23/23 1222  Temp    Pulse 80 09/23/23 1224  Resp 16 09/23/23 1224  SpO2 100 % 09/23/23 1224  Vitals shown include unfiled device data.  Last Pain:  Vitals:   09/23/23 1021  TempSrc: Temporal      Patients Stated Pain Goal: 3 (09/23/23 1021)  Complications: No notable events documented.

## 2023-09-23 NOTE — H&P (Signed)
   Admitting Physician: Hyman Hopes Jozie Wulf  Service: General Surgery  CC: Right inguinal hernia  Subjective   HPI: Marc Vargas is an 78 y.o. male who is here for right inguinal hernia repair  Past Medical History:  Diagnosis Date   Hypertension    Renal disorder     Past Surgical History:  Procedure Laterality Date   SHOULDER SURGERY     TRANSURETHRAL RESECTION OF PROSTATE      History reviewed. No pertinent family history.  Social:  reports that he has never smoked. He has never used smokeless tobacco. He reports that he does not drink alcohol and does not use drugs.  Allergies:  Allergies  Allergen Reactions   Penicillins Other (See Comments)    Headache, and multiple other complaints   Propofol     "Chills" patient does not remember besides chills. Thinks a doctor told him 15 years ago he was allergic.     Medications: Current Outpatient Medications  Medication Instructions   amLODipine-atorvastatin (CADUET) 10-40 MG tablet 1 tablet, Daily   lisinopril (ZESTRIL) 10 mg, Daily    ROS - all of the below systems have been reviewed with the patient and positives are indicated with bold text General: chills, fever or night sweats Eyes: blurry vision or double vision ENT: epistaxis or sore throat Allergy/Immunology: itchy/watery eyes or nasal congestion Hematologic/Lymphatic: bleeding problems, blood clots or swollen lymph nodes Endocrine: temperature intolerance or unexpected weight changes Breast: new or changing breast lumps or nipple discharge Resp: cough, shortness of breath, or wheezing CV: chest pain or dyspnea on exertion GI: as per HPI GU: dysuria, trouble voiding, or hematuria MSK: joint pain or joint stiffness Neuro: TIA or stroke symptoms Derm: pruritus and skin lesion changes Psych: anxiety and depression  Objective   PE Blood pressure (!) 162/71, pulse 75, temperature 98.1 F (36.7 C), temperature source Temporal, resp. rate 20, height 5'  7" (1.702 m), weight 71 kg, SpO2 100%. Constitutional: NAD; conversant; no deformities Eyes: Moist conjunctiva; no lid lag; anicteric; PERRL Neck: Trachea midline; no thyromegaly Lungs: Normal respiratory effort; no tactile fremitus CV: RRR; no palpable thrills; no pitting edema GI: Abd Right inguinal hernia; no palpable hepatosplenomegaly MSK: Normal range of motion of extremities; no clubbing/cyanosis Psychiatric: Appropriate affect; alert and oriented x3 Lymphatic: No palpable cervical or axillary lymphadenopathy  No results found for this or any previous visit (from the past 24 hours).  Imaging Orders  No imaging studies ordered today     Assessment and Plan   Marc Vargas is an 78 y.o. male with a right inguinal hernia, here for laparoscopic repair.  I recommended laparoscopic right inguinal hernia repair with mesh.  We discussed the procedure, its risks, benefits and alternatives and the patient granted consent to proceed.    Quentin Ore, MD  [facility] Use [email or web address] to contact on call provider

## 2023-09-23 NOTE — Op Note (Signed)
   Patient: Marc Vargas (1946-06-17, [id number])  Date of Surgery: 09/23/2023  Preoperative Diagnosis: Right Inguinal Hernia   Postoperative Diagnosis: Right Inguinal Hernia   Surgical Procedure: LAPAROSCOPIC RIGHT INGUINAL HERNIA REPAIR WITH MESH: [id number]   Operative Team Members:  Surgeons and Role:    * Savannha Welle, Hyman Hopes, MD - Primary   Anesthesiologist: Atilano Median, DO CRNA: Cleda Clarks, CRNA; Roosvelt Harps, CRNA   Anesthesia: General   Fluids:  Total I/O In: 350 [I.V.:350] Out: 25 [Blood:25]  Complications: None  Drains:  None  Specimen: None  Disposition:  PACU - hemodynamically stable.  Plan of Care: Discharge to home after PACU  Indications for Procedure: Marc Vargas is a 78 y.o. male who presented with a right inguinal hernia, early after left inguinal hernia repair.  I recommended laparoscopic inguinal hernia repair with mesh.  We discussed the procedure, its risks, benefits and alternatives and the patient granted consent to proceed.  Findings:  Technique: Transabdominal preperitoneal (TAPP) Hernia Location: Right indirect inguinal hernia Mesh Size &Type:  Bard 3D max large right sided mesh Mesh Fixation: Endo-Universal hernia stapler  Infection status: Patient: Private Patient Elective Case Case: Elective Infection Present At Time Of Surgery (PATOS): None   Description of Procedure:  The patient was positioned supine, padded and secured to the bed, with both arms tucked.  The abdomen was widely prepped and draped.  A time out procedure was performed.  A 1 cm infraumbilical incision was made.  The abdomen was entered without trauma to the underlying viscera.  The abdomen was insufflated to 15 mm of Hg.  A 12 mm trocar was inserted at the periumbilical incision.  Additional 5 mm trocars were placed in the left and right abdomen.  There was no trauma to the underlying viscera.  There was an indirect hernia on the RIGHT.  Utilizing a  transabdominal pre peritoneal technique (TAPP), a horizontal incision was made in the peritoneum, immediately below the umbilicus.  Dissection was carried out in the pre peritoneal space down to the level of the hernia sac which was reduced into the peritoneal cavity completely.  The cord contents were parietalized and preserved.  A large pre peritoneal dissection was performed to uncover the direct, indirect, femoral and obturator spaces.  Cooper's ligament was uncovered medially and the psoas muscle uncovered laterally.  The mesh, as documented above, was opened and advanced into the pre peritoneal position so that it more than adequately covered the indirect, direct, femoral and obturator spaces.  The mesh laid flat, with no inferior folds and covered the entire myopectineal orifice.  The mesh was fixated with the endo-universal hernia stapler to Cooper's ligament and the posterior aspect of the rectus muscle.  The peritoneal flap was closed with the same device.  There were no peritoneal defects or exposed mesh at the conclusion.  The umbilical trocar was removed and the fascial defect was closed with a 0 Vicryl suture.  The peritoneal cavity was completely desufflated, the trocars removed and the skin closed with 4-0 Monocryl subcuticular suture and skin glue.  All sponge and needle counts were correct at the end of the case.  Ivar Drape, MD General, Bariatric, & Minimally Invasive Surgery [facility], [place]

## 2023-09-23 NOTE — Anesthesia Preprocedure Evaluation (Signed)
Anesthesia Evaluation  Patient identified by MRN, date of birth, ID band Patient awake    Reviewed: Allergy & Precautions, NPO status , Patient's Chart, lab work & pertinent test results  Airway Mallampati: II  TM Distance: >3 FB Neck ROM: Full    Dental  (+) Poor Dentition   Pulmonary neg pulmonary ROS   Pulmonary exam normal        Cardiovascular hypertension, Pt. on medications  Rhythm:Regular Rate:Normal     Neuro/Psych negative neurological ROS  negative psych ROS   GI/Hepatic Neg liver ROS,,,Right inguinal hernia    Endo/Other  negative endocrine ROS    Renal/GU   negative genitourinary   Musculoskeletal negative musculoskeletal ROS (+)    Abdominal Normal abdominal exam  (+)   Peds  Hematology negative hematology ROS (+)   Anesthesia Other Findings   Reproductive/Obstetrics                             Anesthesia Physical Anesthesia Plan  ASA: 2  Anesthesia Plan: General   Post-op Pain Management: Tylenol PO (pre-op)*   Induction: Intravenous  PONV Risk Score and Plan: 2 and Ondansetron  Airway Management Planned: Mask and LMA  Additional Equipment: None  Intra-op Plan:   Post-operative Plan: Extubation in OR  Informed Consent: I have reviewed the patients History and Physical, chart, labs and discussed the procedure including the risks, benefits and alternatives for the proposed anesthesia with the patient or authorized representative who has indicated his/her understanding and acceptance.     Dental advisory given  Plan Discussed with: CRNA  Anesthesia Plan Comments:        Anesthesia Quick Evaluation

## 2023-09-26 ENCOUNTER — Encounter (HOSPITAL_BASED_OUTPATIENT_CLINIC_OR_DEPARTMENT_OTHER): Payer: Self-pay | Admitting: Surgery

## 2023-10-01 ENCOUNTER — Other Ambulatory Visit: Payer: Self-pay

## 2023-10-01 ENCOUNTER — Emergency Department (HOSPITAL_BASED_OUTPATIENT_CLINIC_OR_DEPARTMENT_OTHER)
Admission: EM | Admit: 2023-10-01 | Discharge: 2023-10-01 | Discharge status: Against Medical Advice | Payer: Medicare Other | Attending: Emergency Medicine | Admitting: Emergency Medicine

## 2023-10-01 ENCOUNTER — Encounter (HOSPITAL_BASED_OUTPATIENT_CLINIC_OR_DEPARTMENT_OTHER): Payer: Self-pay | Admitting: Emergency Medicine

## 2023-10-01 DIAGNOSIS — Z5321 Procedure and treatment not carried out due to patient leaving prior to being seen by health care provider: Secondary | ICD-10-CM | POA: Diagnosis not present

## 2023-10-01 DIAGNOSIS — K6289 Other specified diseases of anus and rectum: Secondary | ICD-10-CM | POA: Diagnosis present

## 2023-10-01 DIAGNOSIS — K625 Hemorrhage of anus and rectum: Secondary | ICD-10-CM | POA: Diagnosis not present

## 2023-10-01 LAB — CBC WITH DIFFERENTIAL/PLATELET
Abs Immature Granulocytes: 0.02 10*3/uL (ref 0.00–0.07)
Basophils Absolute: 0 10*3/uL (ref 0.0–0.1)
Basophils Relative: 0 %
Eosinophils Absolute: 0.1 10*3/uL (ref 0.0–0.5)
Eosinophils Relative: 2 %
HCT: 33 % — ABNORMAL LOW (ref 39.0–52.0)
Hemoglobin: 11.3 g/dL — ABNORMAL LOW (ref 13.0–17.0)
Immature Granulocytes: 0 %
Lymphocytes Relative: 21 %
Lymphs Abs: 1.4 10*3/uL (ref 0.7–4.0)
MCH: 30.6 pg (ref 26.0–34.0)
MCHC: 34.2 g/dL (ref 30.0–36.0)
MCV: 89.4 fL (ref 80.0–100.0)
Monocytes Absolute: 0.6 10*3/uL (ref 0.1–1.0)
Monocytes Relative: 10 %
Neutro Abs: 4.4 10*3/uL (ref 1.7–7.7)
Neutrophils Relative %: 67 %
Platelets: 233 10*3/uL (ref 150–400)
RBC: 3.69 MIL/uL — ABNORMAL LOW (ref 4.22–5.81)
RDW: 13 % (ref 11.5–15.5)
WBC: 6.5 10*3/uL (ref 4.0–10.5)
nRBC: 0 % (ref 0.0–0.2)

## 2023-10-01 LAB — COMPREHENSIVE METABOLIC PANEL
ALT: 19 U/L (ref 0–44)
AST: 32 U/L (ref 15–41)
Albumin: 4.1 g/dL (ref 3.5–5.0)
Alkaline Phosphatase: 76 U/L (ref 38–126)
Anion gap: 9 (ref 5–15)
BUN: 24 mg/dL — ABNORMAL HIGH (ref 8–23)
CO2: 21 mmol/L — ABNORMAL LOW (ref 22–32)
Calcium: 9.1 mg/dL (ref 8.9–10.3)
Chloride: 106 mmol/L (ref 98–111)
Creatinine, Ser: 1.9 mg/dL — ABNORMAL HIGH (ref 0.61–1.24)
GFR, Estimated: 36 mL/min — ABNORMAL LOW (ref >60)
Glucose, Bld: 125 mg/dL — ABNORMAL HIGH (ref 70–99)
Potassium: 3.7 mmol/L (ref 3.5–5.1)
Sodium: 136 mmol/L (ref 135–145)
Total Bilirubin: 2.4 mg/dL — ABNORMAL HIGH (ref 0.0–1.2)
Total Protein: 7.5 g/dL (ref 6.5–8.1)

## 2023-10-01 NOTE — ED Triage Notes (Signed)
 Pt c/o bright red rectal bleeding since Tues; c/o rectal pain; also reports bulge to RT groin (had hernia repair 1/31)

## 2023-10-04 ENCOUNTER — Emergency Department (HOSPITAL_COMMUNITY)
Admission: EM | Admit: 2023-10-04 | Discharge: 2023-10-04 | Discharge status: Against Medical Advice | Payer: Medicare Other | Attending: Emergency Medicine | Admitting: Emergency Medicine

## 2023-10-04 ENCOUNTER — Encounter (HOSPITAL_COMMUNITY): Payer: Self-pay

## 2023-10-04 ENCOUNTER — Emergency Department (HOSPITAL_COMMUNITY): Payer: Medicare Other

## 2023-10-04 ENCOUNTER — Other Ambulatory Visit: Payer: Self-pay

## 2023-10-04 DIAGNOSIS — N5089 Other specified disorders of the male genital organs: Secondary | ICD-10-CM | POA: Insufficient documentation

## 2023-10-04 DIAGNOSIS — R1084 Generalized abdominal pain: Secondary | ICD-10-CM | POA: Diagnosis present

## 2023-10-04 DIAGNOSIS — K6289 Other specified diseases of anus and rectum: Secondary | ICD-10-CM | POA: Diagnosis not present

## 2023-10-04 LAB — CBC WITH DIFFERENTIAL/PLATELET
Abs Immature Granulocytes: 0.05 10*3/uL (ref 0.00–0.07)
Basophils Absolute: 0 10*3/uL (ref 0.0–0.1)
Basophils Relative: 0 %
Eosinophils Absolute: 0.1 10*3/uL (ref 0.0–0.5)
Eosinophils Relative: 2 %
HCT: 35.3 % — ABNORMAL LOW (ref 39.0–52.0)
Hemoglobin: 12.1 g/dL — ABNORMAL LOW (ref 13.0–17.0)
Immature Granulocytes: 1 %
Lymphocytes Relative: 18 %
Lymphs Abs: 1.5 10*3/uL (ref 0.7–4.0)
MCH: 31.2 pg (ref 26.0–34.0)
MCHC: 34.3 g/dL (ref 30.0–36.0)
MCV: 91 fL (ref 80.0–100.0)
Monocytes Absolute: 0.6 10*3/uL (ref 0.1–1.0)
Monocytes Relative: 8 %
Neutro Abs: 5.9 10*3/uL (ref 1.7–7.7)
Neutrophils Relative %: 71 %
Platelets: 289 10*3/uL (ref 150–400)
RBC: 3.88 MIL/uL — ABNORMAL LOW (ref 4.22–5.81)
RDW: 13.2 % (ref 11.5–15.5)
WBC: 8.2 10*3/uL (ref 4.0–10.5)
nRBC: 0 % (ref 0.0–0.2)

## 2023-10-04 LAB — BASIC METABOLIC PANEL
Anion gap: 7 (ref 5–15)
BUN: 22 mg/dL (ref 8–23)
CO2: 21 mmol/L — ABNORMAL LOW (ref 22–32)
Calcium: 8.8 mg/dL — ABNORMAL LOW (ref 8.9–10.3)
Chloride: 106 mmol/L (ref 98–111)
Creatinine, Ser: 2.09 mg/dL — ABNORMAL HIGH (ref 0.61–1.24)
GFR, Estimated: 32 mL/min — ABNORMAL LOW (ref >60)
Glucose, Bld: 117 mg/dL — ABNORMAL HIGH (ref 70–99)
Potassium: 4.8 mmol/L (ref 3.5–5.1)
Sodium: 134 mmol/L — ABNORMAL LOW (ref 135–145)

## 2023-10-04 NOTE — ED Notes (Signed)
Patient transported to CT

## 2023-10-04 NOTE — ED Provider Notes (Signed)
Care assumed from previous writer at shift change.  See note for full HPI.  In summation 78 year old who recent hernia repair about 12 days ago with [facility] here for evaluation of rectal pain and groin swelling.  Previous Clinical research associate spoke with general surgery who recommended CT scan.  Plan to follow-up on imaging and dispo appropriately. Physical Exam  BP (!) 162/73 (BP Location: Right Arm)   Pulse 87   Temp 97.9 F (36.6 C) (Oral)   Resp 15   Ht 5\' 6"  (1.676 m)   Wt 69.4 kg   SpO2 100%   BMI 24.69 kg/m   Physical Exam  Procedures  Procedures Labs Reviewed  CBC WITH DIFFERENTIAL/PLATELET - Abnormal; Notable for the following components:      Result Value   RBC 3.88 (*)    Hemoglobin 12.1 (*)    HCT 35.3 (*)    All other components within normal limits  BASIC METABOLIC PANEL - Abnormal; Notable for the following components:   Sodium 134 (*)    CO2 21 (*)    Glucose, Bld 117 (*)    Creatinine, Ser 2.09 (*)    Calcium 8.8 (*)    GFR, Estimated 32 (*)    All other components within normal limits   CT ABDOMEN PELVIS WO CONTRAST Result Date: 10/04/2023 CLINICAL DATA:  Scrotal mass pain and swelling post inguinal hernia repair EXAM: CT ABDOMEN AND PELVIS WITHOUT CONTRAST TECHNIQUE: Multidetector CT imaging of the abdomen and pelvis was performed following the standard protocol without IV contrast. RADIATION DOSE REDUCTION: This exam was performed according to the departmental dose-optimization program which includes automated exposure control, adjustment of the mA and/or kV according to patient size and/or use of iterative reconstruction technique. COMPARISON:  CT 11/28/2022, 11/24/2022 FINDINGS: Lower chest: Lung bases demonstrate no acute airspace disease. Coronary vascular calcification. Trace pericardial effusion. Hepatobiliary: Gallstones. No focal hepatic abnormality or biliary dilatation. Pancreas: Unremarkable. No pancreatic ductal dilatation or surrounding inflammatory  changes. Spleen: Normal in size without focal abnormality. Adrenals/Urinary Tract: Stable adrenal glands. Multiple low-density cysts within the kidneys for which no imaging follow-up is recommended. Upper pole complex cyst measuring 6.7 x 6.8 cm with mural nodularity, series 3, image 25. Thick-walled urinary bladder. Stomach/Bowel: Stomach nonenlarged. No dilated small bowel. No acute bowel wall thickening. Moderate to large fecal ball in the rectum with some perirectal thickening but no stranding. Vascular/Lymphatic: Moderate aortic atherosclerosis. No aneurysm. No suspicious lymph nodes. Reproductive: Enlarged prostate Other: Negative for pelvic effusion or free air. Small fat containing left inguinal hernia. Surgical clips at the right inguinal region presumably corresponding to history of hernia repair. Large slightly dense right scrotal fluid collection, extends into the right inguinal canal. 3.9 x 3.2 cm ovoid density in the right inguinal canal on series 3, image 80, coronal series 6, image 99. Musculoskeletal: No acute or suspicious osseous abnormality IMPRESSION: 1. Surgical clips at the right inguinal region presumably corresponding to history of hernia repair. Large slightly dense right scrotal fluid collection extends into the right inguinal canal, either representing hematoma or large postoperative fluid collection. 3.9 x 3.2 cm ovoid density in the right inguinal canal, uncertain if this represents focal hematoma versus displaced testicle from scrotal fluid collection. Correlation with scrotal ultrasound could be helpful. 2. Moderate to large fecal ball in the rectum with some perirectal thickening but no stranding, suggests fecal impaction. Large stool burden consistent with constipation 3. Gallstones. 4. Complex cystic lesion at the upper pole of the right kidney  measuring up to 6.8 cm with mural nodularity suspicious for neoplasm. When the patient is clinically stable and able to follow directions  and hold their breath (preferably as an outpatient) further evaluation with dedicated abdominal MRI should be considered. 5. Enlarged prostate with diffuse bladder wall thickening 6. Aortic atherosclerosis. Aortic Atherosclerosis (ICD10-I70.0). Electronically Signed   By: Jasmine Pang M.D.   On: 10/04/2023 16:36    ED Course / MDM   Clinical Course as of 10/04/23 1715  Tue Oct 04, 2023  1503 Inguinal hernia repair Jan with [facility]. Seen yesterday in office. Having swelling and pain to area. [place] PA spoke with Earl Gala PA with gen surgery, rec Ct. Prob seroma? can dc home if only seroma. If abscess or hernia reconsult. Constipated as well, can dc home on Miralax [BH]  1645 Went to assess patient-- not in room [BH]  1702 Can want to reassess patient--not in room.  Nursing checked in the bathrooms, patient not found.  Nursing to call patient [BH]    Clinical Course User Index [BH] Danah Reinecke A, PA-C   Care assumed from previous writer at shift change.  See note for full HPI.  In summation 78 year old who recent hernia repair about 12 days ago with [facility] here for evaluation of rectal pain and groin swelling.  Previous Clinical research associate spoke with general surgery who recommended CT scan.  Plan to follow-up on imaging and dispo appropriately.  CT scan shows constipation, suspected fecal impaction as well as hematoma versus large postop fluid collection with density in the right inguinal canal unsure if this is hematoma versus displaced testicle from scrotal fluid collection, correlate with ultrasound.  I went to reassess patient multiple times.  He was not found in the room.  He was not in the CT scanner.  Nursing intact looked in bathrooms, hallways multiple times.  It appears patient collected his belongings and shoes and eloped from the emergency department.  Nursing attempted to contact patient as well as emergency contact without answer.  I attempted to call patient however no  answer on phone.  Patient eloped from the emergency department prior to my assessment or discussion of imaging   Medical Decision Making Amount and/or Complexity of Data Reviewed External Data Reviewed: labs, radiology and notes. Labs: ordered. Decision-making details documented in ED Course. Radiology: ordered and independent interpretation performed. Decision-making details documented in ED Course.          Shanell Aden A, PA-C 10/04/23 1716    Linwood Dibbles, MD 10/05/23 [phone]

## 2023-10-04 NOTE — ED Triage Notes (Signed)
Reports pain in rectum.  Reports having Mandrell bowel movements with smears of blood on toilet paper.  Denies abd pain or nausea vomiting.

## 2023-10-04 NOTE — ED Provider Notes (Cosign Needed)
[place] EMERGENCY DEPARTMENT AT [facility] Provider Note   CSN: [id number] Arrival date & time: 10/04/23  1025     History  Chief Complaint  Patient presents with   Rectal Pain    Marc Vargas is a 78 y.o. male.  Patient complains of abdominal pain.  Patient had a hernia repair on January 31.  Patient states he felt like area was improving.  Patient complains of increasing pain.  Patient states that he feels like he is more swollen than he was before.  Patient complains of decreased bowel movements.  He states he is only passing a small amount of stool.  Patient denies any fever he denies any chills.  He states after surgery the area in the right groin was flat but now area is swollen.  The history is provided by the patient. No language interpreter was used.       Home Medications Prior to Admission medications   Medication Sig Start Date End Date Taking? Authorizing Provider  amLODipine-atorvastatin (CADUET) 10-40 MG tablet Take 1 tablet by mouth daily.    [provider]  lisinopril (PRINIVIL,ZESTRIL) 10 MG tablet Take 10 mg by mouth daily.    [provider]      Allergies    Penicillins and Propofol    Review of Systems   Review of Systems  All other systems reviewed and are negative.   Physical Exam Updated Vital Signs BP (!) 162/73 (BP Location: Right Arm)   Pulse 87   Temp 97.9 F (36.6 C) (Oral)   Resp 15   Ht 5\' 6"  (1.676 m)   Wt 69.4 kg   SpO2 100%   BMI 24.69 kg/m  Physical Exam Vitals and nursing note reviewed.  Constitutional:      Appearance: He is well-developed.  HENT:     Head: Normocephalic.  Cardiovascular:     Rate and Rhythm: Normal rate.  Pulmonary:     Effort: Pulmonary effort is normal.  Abdominal:     General: There is no distension.     Comments: Tender swollen right lower abdomen/suprapubic area, swelling right testicle.  Musculoskeletal:        General: Normal range of motion.     Cervical  back: Normal range of motion.  Skin:    General: Skin is warm.  Neurological:     General: No focal deficit present.     Mental Status: He is alert and oriented to person, place, and time.     ED Results / Procedures / Treatments   Labs (all labs ordered are listed, but only abnormal results are displayed) Labs Reviewed  CBC WITH DIFFERENTIAL/PLATELET - Abnormal; Notable for the following components:      Result Value   RBC 3.88 (*)    Hemoglobin 12.1 (*)    HCT 35.3 (*)    All other components within normal limits  BASIC METABOLIC PANEL - Abnormal; Notable for the following components:   Sodium 134 (*)    CO2 21 (*)    Glucose, Bld 117 (*)    Creatinine, Ser 2.09 (*)    Calcium 8.8 (*)    GFR, Estimated 32 (*)    All other components within normal limits    EKG None  Radiology No results found.  Procedures Procedures    Medications Ordered in ED Medications - No data to display  ED Course/ Medical Decision Making/ A&P  Medical Decision Making Patient complains of increased swelling in his right scrotum.  Patient had a inguinal hernia repair on January 31.  Patient reports increased pain today.  Patient also reports only having very small bowel movements  Amount and/or Complexity of Data Reviewed Labs: ordered. Decision-making details documented in ED Course.    Details: Lab was ordered reviewed and interpreted patient has a normal white blood cell count.  GFR is 32. Radiology: ordered.    Details: CT scan abdomen and pelvis ordered. Discussion of management or test interpretation with external provider(s): I spoke with Barnetta Chapel, PA with general surgeon regarding patient's swelling and pain.  She reports this may be a seroma she recommended obtaining a CT scan.           Final Clinical Impression(s) / ED Diagnoses Final diagnoses:  Generalized abdominal pain  Scrotal swelling    Rx / DC Orders ED Discharge  Orders     None         Elson Areas, [place] 10/04/23 1434

## 2023-10-04 NOTE — ED Notes (Signed)
Close friend Erin Fulling [phone] would like an update asap

## 2023-10-20 ENCOUNTER — Other Ambulatory Visit (HOSPITAL_COMMUNITY): Payer: Self-pay | Admitting: Surgery

## 2023-10-20 ENCOUNTER — Ambulatory Visit (HOSPITAL_BASED_OUTPATIENT_CLINIC_OR_DEPARTMENT_OTHER)
Admission: RE | Admit: 2023-10-20 | Discharge: 2023-10-20 | Disposition: A | Discharge status: Home / Self Care | Payer: Medicare Other | Source: Ambulatory Visit | Attending: Surgery | Admitting: Surgery

## 2023-10-20 DIAGNOSIS — R109 Unspecified abdominal pain: Secondary | ICD-10-CM

## 2023-10-20 DIAGNOSIS — Z9889 Other specified postprocedural states: Secondary | ICD-10-CM

## 2023-10-21 ENCOUNTER — Inpatient Hospital Stay (HOSPITAL_COMMUNITY): Admit: 2023-10-21 | Payer: Medicare Other | Admitting: Surgery

## 2023-10-21 ENCOUNTER — Inpatient Hospital Stay: Admit: 2023-10-21 | Admitting: Surgery

## 2023-10-21 ENCOUNTER — Encounter (HOSPITAL_COMMUNITY): Payer: Self-pay

## 2023-10-24 ENCOUNTER — Emergency Department (HOSPITAL_COMMUNITY)
Admission: EM | Admit: 2023-10-24 | Discharge: 2023-10-24 | Disposition: A | Discharge status: Home / Self Care | Attending: Emergency Medicine | Admitting: Emergency Medicine

## 2023-10-24 ENCOUNTER — Encounter (HOSPITAL_COMMUNITY): Payer: Self-pay

## 2023-10-24 ENCOUNTER — Emergency Department (HOSPITAL_COMMUNITY)

## 2023-10-24 DIAGNOSIS — R1909 Other intra-abdominal and pelvic swelling, mass and lump: Secondary | ICD-10-CM | POA: Diagnosis present

## 2023-10-24 LAB — URINALYSIS, ROUTINE W REFLEX MICROSCOPIC
Bilirubin Urine: NEGATIVE
Glucose, UA: NEGATIVE mg/dL
Hgb urine dipstick: NEGATIVE
Ketones, ur: NEGATIVE mg/dL
Leukocytes,Ua: NEGATIVE
Nitrite: NEGATIVE
Protein, ur: NEGATIVE mg/dL
Specific Gravity, Urine: 1.005 (ref 1.005–1.030)
pH: 7 (ref 5.0–8.0)

## 2023-10-24 LAB — CBC WITH DIFFERENTIAL/PLATELET
Abs Immature Granulocytes: 0.02 10*3/uL (ref 0.00–0.07)
Basophils Absolute: 0 10*3/uL (ref 0.0–0.1)
Basophils Relative: 0 %
Eosinophils Absolute: 0.1 10*3/uL (ref 0.0–0.5)
Eosinophils Relative: 2 %
HCT: 32.2 % — ABNORMAL LOW (ref 39.0–52.0)
Hemoglobin: 10.7 g/dL — ABNORMAL LOW (ref 13.0–17.0)
Immature Granulocytes: 0 %
Lymphocytes Relative: 22 %
Lymphs Abs: 1.5 10*3/uL (ref 0.7–4.0)
MCH: 31.1 pg (ref 26.0–34.0)
MCHC: 33.2 g/dL (ref 30.0–36.0)
MCV: 93.6 fL (ref 80.0–100.0)
Monocytes Absolute: 0.6 10*3/uL (ref 0.1–1.0)
Monocytes Relative: 10 %
Neutro Abs: 4.4 10*3/uL (ref 1.7–7.7)
Neutrophils Relative %: 66 %
Platelets: 339 10*3/uL (ref 150–400)
RBC: 3.44 MIL/uL — ABNORMAL LOW (ref 4.22–5.81)
RDW: 14.1 % (ref 11.5–15.5)
WBC: 6.6 10*3/uL (ref 4.0–10.5)
nRBC: 0 % (ref 0.0–0.2)

## 2023-10-24 LAB — HEPATIC FUNCTION PANEL
ALT: 40 U/L (ref 0–44)
AST: 21 U/L (ref 15–41)
Albumin: 3.5 g/dL (ref 3.5–5.0)
Alkaline Phosphatase: 143 U/L — ABNORMAL HIGH (ref 38–126)
Bilirubin, Direct: 0.2 mg/dL (ref 0.0–0.2)
Indirect Bilirubin: 0.8 mg/dL (ref 0.3–0.9)
Total Bilirubin: 1 mg/dL (ref 0.0–1.2)
Total Protein: 6.5 g/dL (ref 6.5–8.1)

## 2023-10-24 LAB — BASIC METABOLIC PANEL
Anion gap: 11 (ref 5–15)
BUN: 28 mg/dL — ABNORMAL HIGH (ref 8–23)
CO2: 23 mmol/L (ref 22–32)
Calcium: 9.1 mg/dL (ref 8.9–10.3)
Chloride: 103 mmol/L (ref 98–111)
Creatinine, Ser: 1.68 mg/dL — ABNORMAL HIGH (ref 0.61–1.24)
GFR, Estimated: 42 mL/min — ABNORMAL LOW (ref >60)
Glucose, Bld: 107 mg/dL — ABNORMAL HIGH (ref 70–99)
Potassium: 4.2 mmol/L (ref 3.5–5.1)
Sodium: 137 mmol/L (ref 135–145)

## 2023-10-24 NOTE — ED Notes (Signed)
 Pts PCP called & reported to this Triage RN that pt was to be a direct admit this past Friday but he did not show up when he was directed to with the plan to get his gallbladder removed. He also reported that pt would benefit from a UA d/t his last CT showing cystitis (per provider).

## 2023-10-24 NOTE — ED Notes (Addendum)
 Pt to ED via POV reports was told to come to ED from surgeon office with recent inguinal hernia repair 3 weeks ago, for "bladder problem", reports spoke to a nurse on the phone.  hard to collect full story from patient. Pt denies bladder symptoms, reports no difficulty with urination.. Pt does report pain in groin.

## 2023-10-24 NOTE — ED Notes (Signed)
 Patient transported to Ultrasound

## 2023-10-24 NOTE — Discharge Instructions (Signed)
 You have been seen and discharged from the emergency department.  Your workup today showed no finding of acute infection of the gallbladder or bladder.  Review of imaging shows a fluid collection in the right groin and right scrotum.  This appears to be a postoperative fluid collection.  No evidence of abscess, bleeding.  At the hernia has not recurred and looks repaired.  On-call general surgery is recommended that you follow-up in the office with Dr. Dossie Der for reevaluation of this fluid collection.  Take home medications as prescribed. If you have any worsening symptoms or further concerns for your health please return to an emergency department for further evaluation.

## 2023-10-24 NOTE — ED Provider Notes (Signed)
 [place] EMERGENCY DEPARTMENT AT [facility] Provider Note   CSN: [id number] Arrival date & time: 10/24/23  1227     History  Chief Complaint  Patient presents with   Post-op Problem    Marc Vargas is a 78 y.o. male.  HPI   78 year old male with previous right inguinal hernia repair and persistent groin/scrotal swelling presents to the emergency department with abnormal CT imaging findings as an outpatient.  Patient follows with Dr. Dossie Der who performed the inguinal hernia surgery.  There has been persistent swelling of the been following as an outpatient so a CT was reordered with findings concerning for cholecystitis and possible cystitis.  Patient's main complaint is the ongoing groin and scrotal swelling.  He denies any acute abdominal pain, vomiting, diarrhea, dysuria.  Home Medications Prior to Admission medications   Medication Sig Start Date End Date Taking? Authorizing Provider  amLODipine-atorvastatin (CADUET) 10-40 MG tablet Take 1 tablet by mouth daily.    [provider]  lisinopril (PRINIVIL,ZESTRIL) 10 MG tablet Take 10 mg by mouth daily.    [provider]      Allergies    Penicillins and Propofol    Review of Systems   Review of Systems  Constitutional:  Negative for fever.  Respiratory:  Negative for shortness of breath.   Cardiovascular:  Negative for chest pain.  Gastrointestinal:  Negative for abdominal pain, diarrhea and vomiting.       + Right groin swelling  Genitourinary:  Positive for scrotal swelling.  Skin:  Negative for rash.  Neurological:  Negative for headaches.    Physical Exam Updated Vital Signs BP (!) 170/85 (BP Location: Left Arm)   Pulse 74   Temp 98 F (36.7 C) (Oral)   Resp 18   Ht 5\' 6"  (1.676 m)   Wt 69.4 kg   SpO2 100%   BMI 24.69 kg/m  Physical Exam Vitals and nursing note reviewed.  Constitutional:      Appearance: Normal appearance.  HENT:     Head: Normocephalic.      Mouth/Throat:     Mouth: Mucous membranes are moist.  Cardiovascular:     Rate and Rhythm: Normal rate.  Pulmonary:     Effort: Pulmonary effort is normal. No respiratory distress.  Abdominal:     Palpations: Abdomen is soft.     Tenderness: There is no abdominal tenderness.     Comments: Firm palpated right groin swelling leading into firm scrotal swelling on the right.  Otherwise negative Murphy sign.  Skin:    General: Skin is warm.  Neurological:     Mental Status: He is alert and oriented to person, place, and time. Mental status is at baseline.  Psychiatric:        Mood and Affect: Mood normal.     ED Results / Procedures / Treatments   Labs (all labs ordered are listed, but only abnormal results are displayed) Labs Reviewed  CBC WITH DIFFERENTIAL/PLATELET - Abnormal; Notable for the following components:      Result Value   RBC 3.44 (*)    Hemoglobin 10.7 (*)    HCT 32.2 (*)    All other components within normal limits  BASIC METABOLIC PANEL - Abnormal; Notable for the following components:   Glucose, Bld 107 (*)    BUN 28 (*)    Creatinine, Ser 1.68 (*)    GFR, Estimated 42 (*)    All other components within normal limits  URINALYSIS, ROUTINE  W REFLEX MICROSCOPIC  HEPATIC FUNCTION PANEL    EKG None  Radiology No results found.  Procedures Procedures    Medications Ordered in ED Medications - No data to display  ED Course/ Medical Decision Making/ A&P                                 Medical Decision Making Amount and/or Complexity of Data Reviewed Labs: ordered. Radiology: ordered.   78 year old male who is status post right inguinal hernia repair with Dr. Dossie Der presents to the emergency department with concern to rule out cholecystitis, cystitis and evaluate ongoing right groin/scrotal swelling.  An outpatient CT had been ordered about a week ago.  This showed a fluid collection in the right groin extending into the scrotal area, concerning  for seroma, hematoma or possible infection.  There was also note of gallbladder wall thickening and bladder thickening.  He was sent in for evaluation of the above.  Vitals are normal and stable on arrival.  Patient is afebrile, normal white count.  Kidney function is improved from baseline.  Hepatic panel is normal.  Patient has a benign abdomen, negative Murphy sign.  Ultrasound shows borderline gallbladder wall thickening but no other acute findings of acute cholecystitis.  Urinalysis is normal, no findings of cystitis.  In regards to the ongoing groin/scrotal swelling I consulted with on-call general surgeon, Dr. Freida Busman.  She is reviewed the imaging.  This looks most consistent with a postoperative seroma and no emergent management is indicated at this time.  Patient will follow-up in the office as an outpatient for further care.  Patient at this time appears safe and stable for discharge and close outpatient follow up. Discharge plan and strict return to ED precautions discussed, patient verbalizes understanding and agreement.        Final Clinical Impression(s) / ED Diagnoses Final diagnoses:  None    Rx / DC Orders ED Discharge Orders     None         Rozelle Logan, DO 10/24/23 2310

## 2023-10-24 NOTE — ED Notes (Signed)
 Pt requesting to speak with EDP regarding update on [place] results. Message sent to EDP.

## 2023-10-31 ENCOUNTER — Other Ambulatory Visit: Payer: Self-pay | Admitting: Urology

## 2023-10-31 DIAGNOSIS — N281 Cyst of kidney, acquired: Secondary | ICD-10-CM

## 2024-03-02 IMAGING — DX DG SHOULDER 2+V*L*
4 series · 4 of 4 positions shown · non-contrast
Comparison: None.

CLINICAL DATA: Shoulder pain

EXAM:
LEFT SHOULDER - 2+ VIEW

[shoulder grashey]
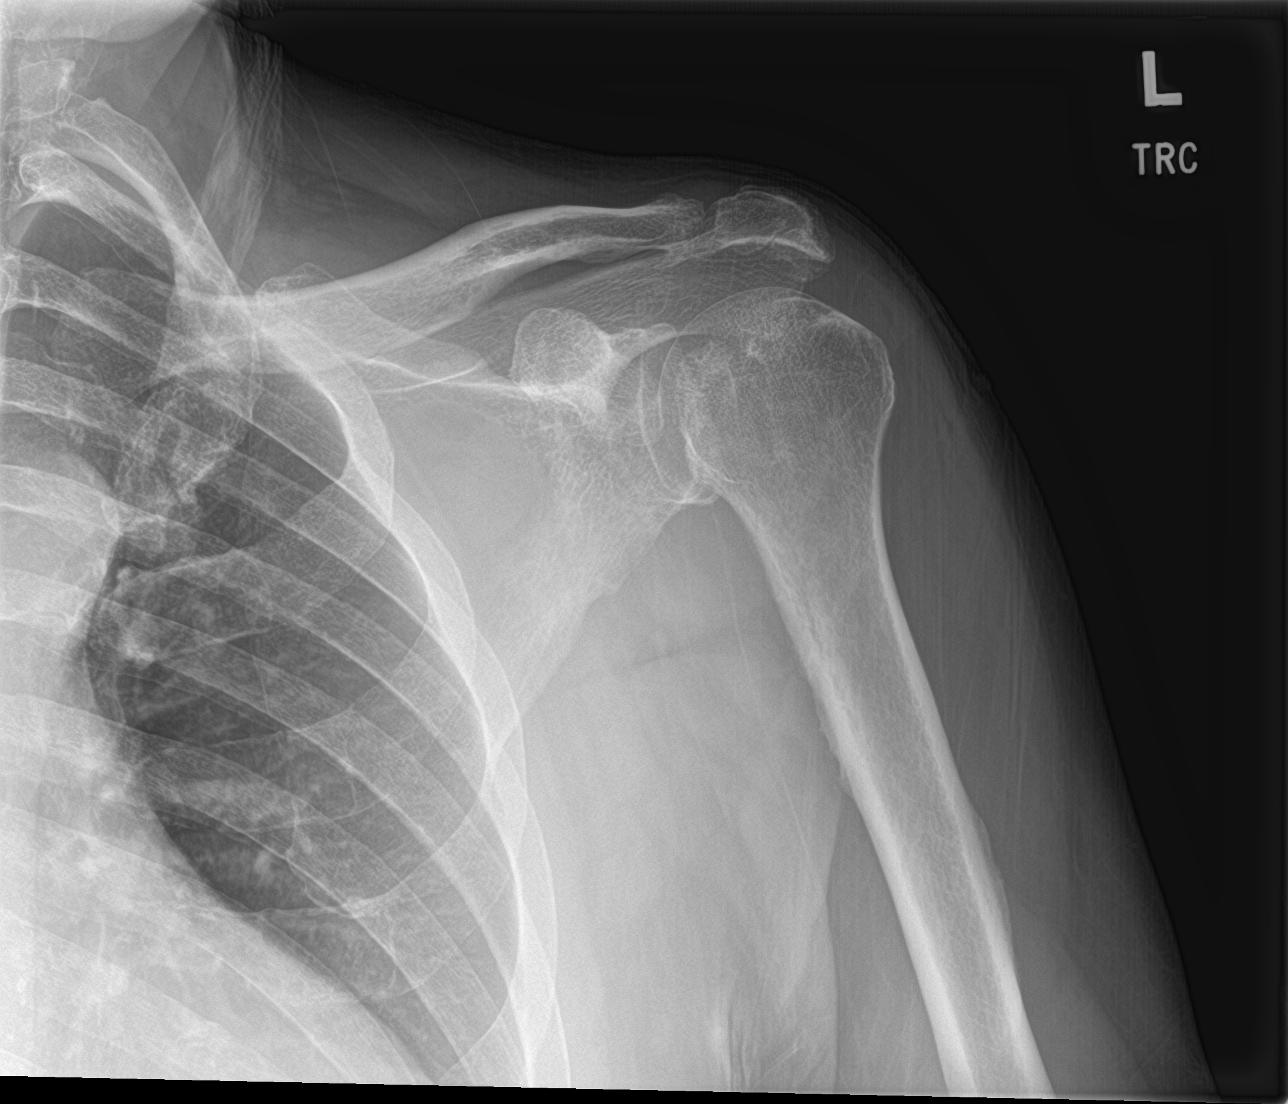

[shoulder y view]
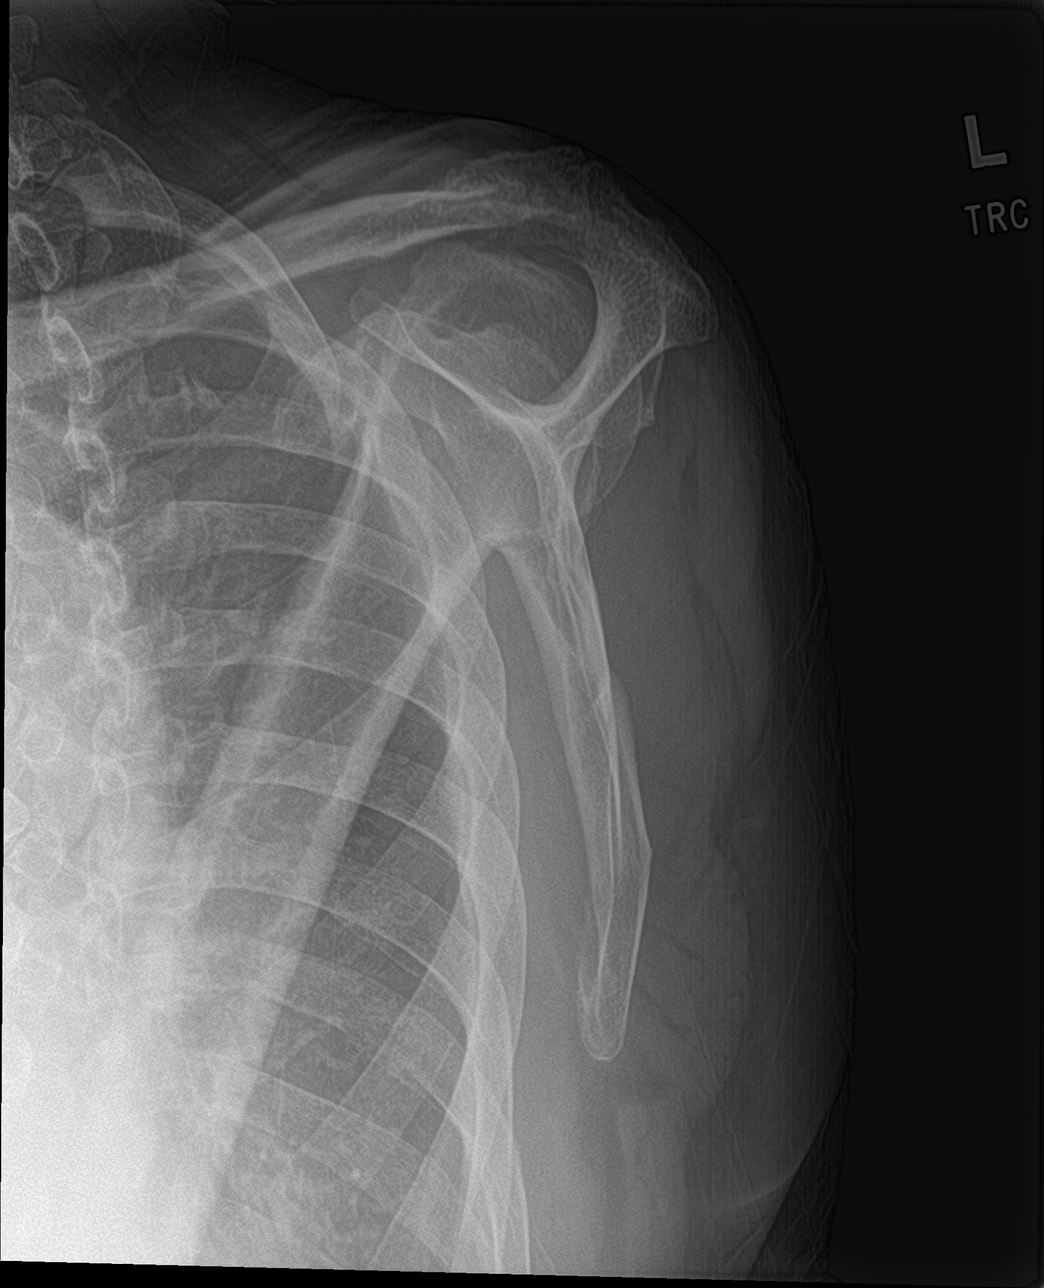

[shoulder axillary]
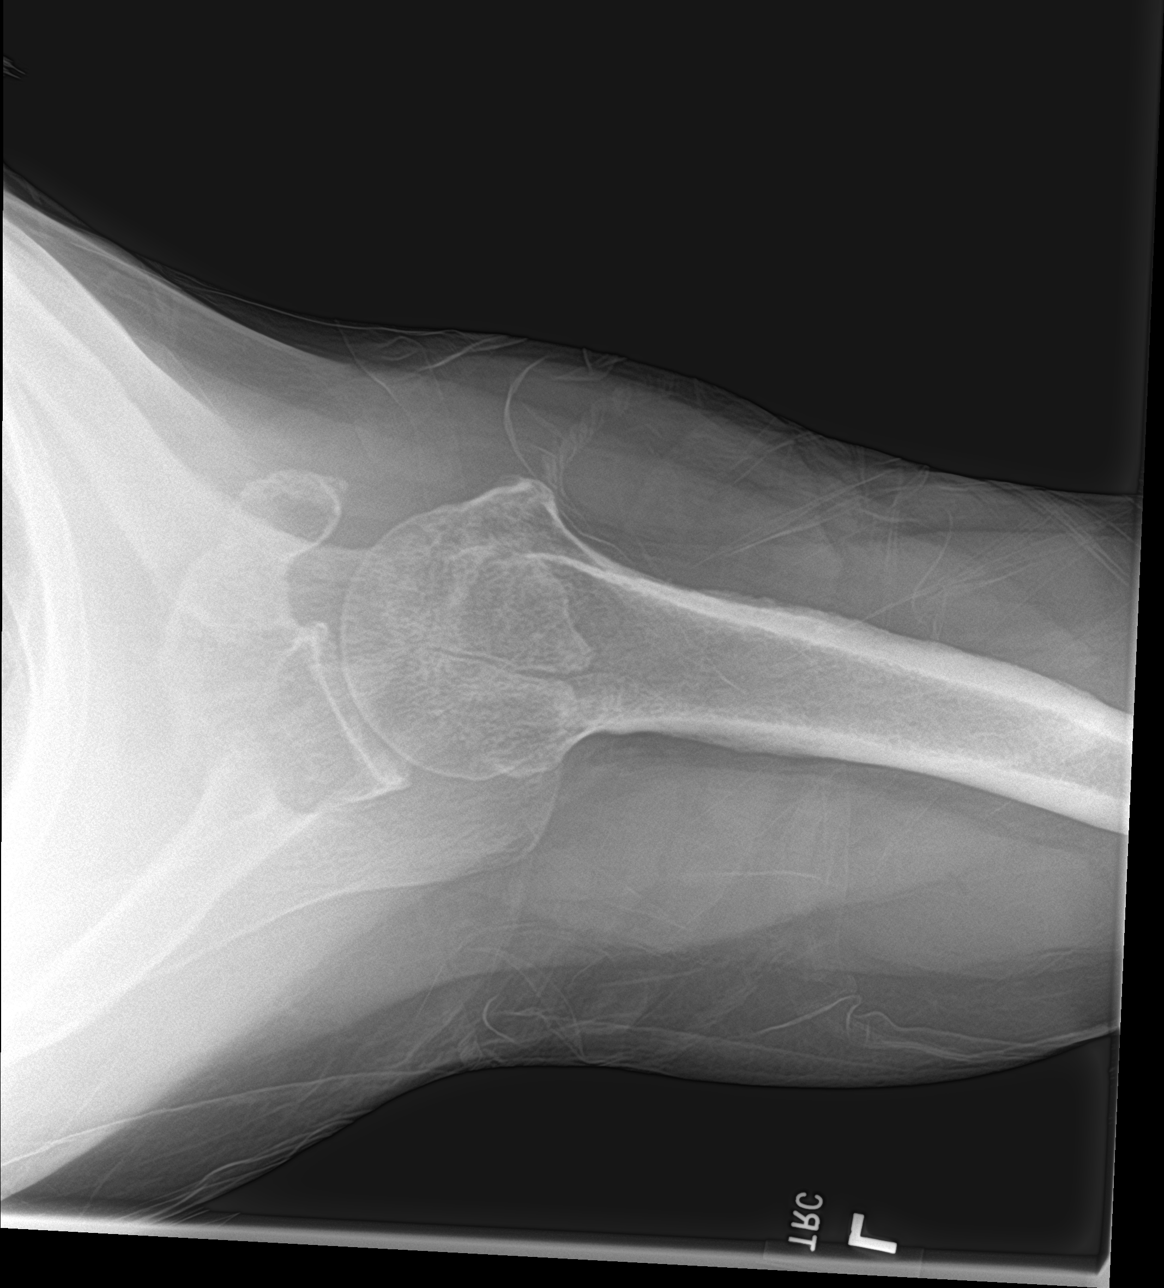

[shoulder ap neutral]
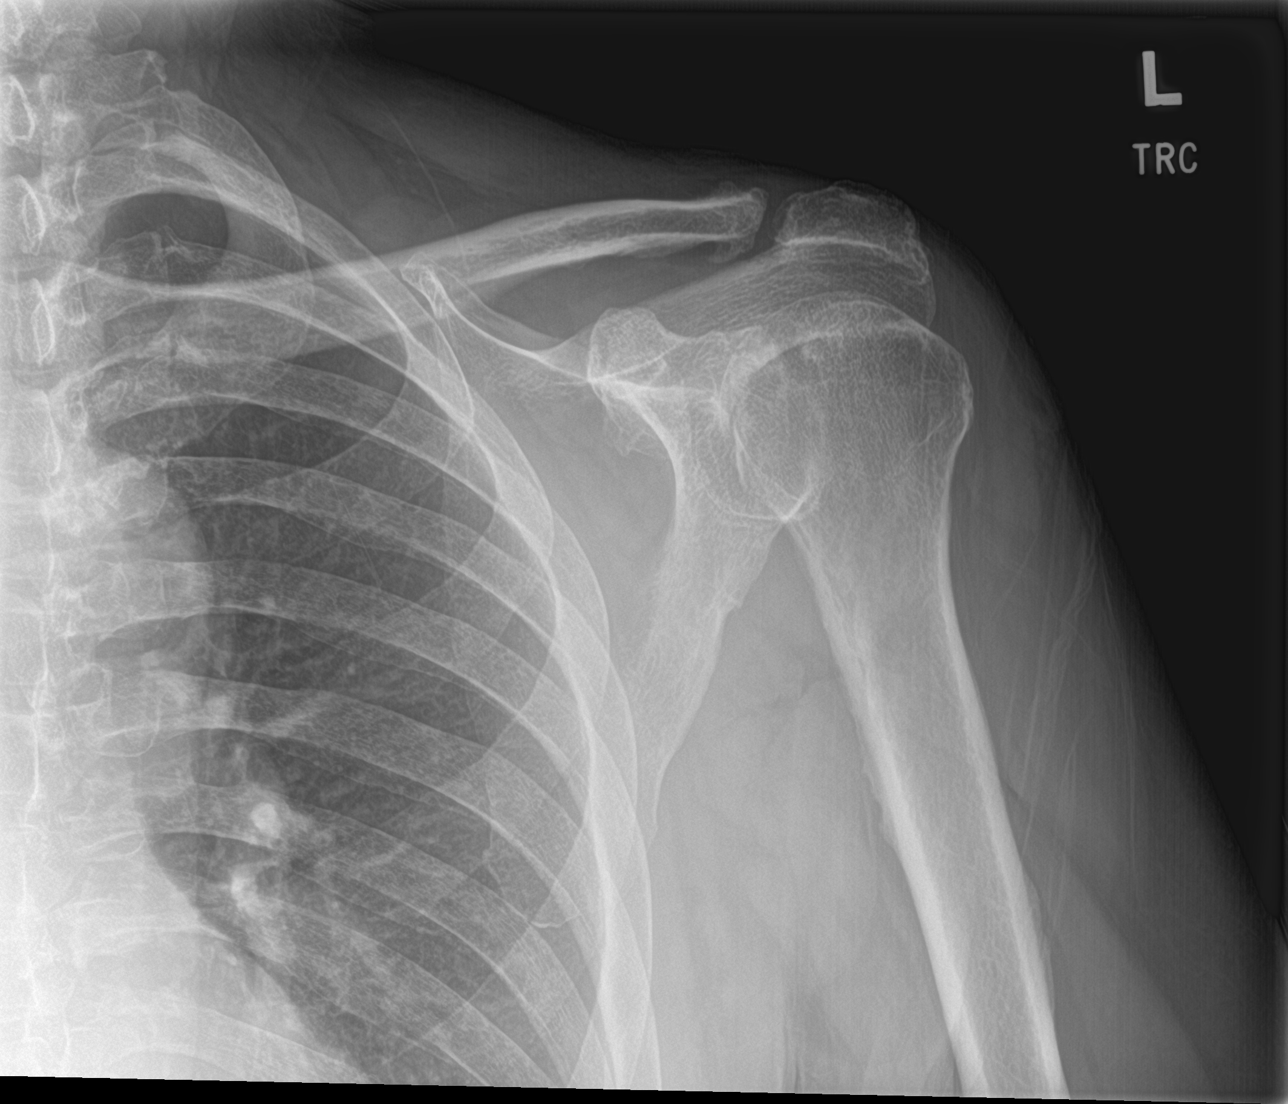

[4 of 4 positions shown; findings below may reference images not displayed]

FINDINGS: No acute fracture or dislocation identified. Moderate narrowing of
the glenohumeral joint and mild narrowing of the acromioclavicular
joint with prominent inferior marginal osteophytes at the AC joint.
IMPRESSION: Degenerative changes with no acute osseous abnormality identified.
